# Patient Record
Sex: Female | Born: 1975 | Race: Black or African American | Hispanic: No | Marital: Single | Smoking: Never smoker
Health system: Southern US, Community
[De-identification: ages and names within clinical notes are randomized; demographics above are authoritative.]

## PROBLEM LIST (undated history)

## (undated) DIAGNOSIS — Z90410 Acquired total absence of pancreas: Secondary | ICD-10-CM

## (undated) DIAGNOSIS — E139 Other specified diabetes mellitus without complications: Secondary | ICD-10-CM

## (undated) DIAGNOSIS — I10 Essential (primary) hypertension: Secondary | ICD-10-CM

## (undated) DIAGNOSIS — E119 Type 2 diabetes mellitus without complications: Secondary | ICD-10-CM

## (undated) DIAGNOSIS — E891 Postprocedural hypoinsulinemia: Secondary | ICD-10-CM

## (undated) DIAGNOSIS — Z9081 Acquired absence of spleen: Secondary | ICD-10-CM

## (undated) HISTORY — PX: SPLENECTOMY, PARTIAL: SHX787

## (undated) HISTORY — PX: HERNIA REPAIR: SHX51

## (undated) HISTORY — PX: KNEE SURGERY: SHX244

---

## 1998-12-02 ENCOUNTER — Emergency Department (HOSPITAL_COMMUNITY): Admission: EM | Admit: 1998-12-02 | Discharge: 1998-12-02 | Payer: Self-pay | Admitting: Emergency Medicine

## 2001-07-03 ENCOUNTER — Emergency Department (HOSPITAL_COMMUNITY): Admission: EM | Admit: 2001-07-03 | Discharge: 2001-07-04 | Payer: Self-pay

## 2003-05-17 ENCOUNTER — Other Ambulatory Visit: Admission: RE | Admit: 2003-05-17 | Discharge: 2003-05-17 | Payer: Self-pay | Admitting: Obstetrics and Gynecology

## 2003-07-19 ENCOUNTER — Encounter: Admission: RE | Admit: 2003-07-19 | Discharge: 2003-10-17 | Payer: Self-pay | Admitting: Family Medicine

## 2004-07-25 ENCOUNTER — Encounter: Admission: RE | Admit: 2004-07-25 | Discharge: 2004-10-23 | Payer: Self-pay | Admitting: Family Medicine

## 2005-03-23 ENCOUNTER — Ambulatory Visit (HOSPITAL_COMMUNITY): Admission: RE | Admit: 2005-03-23 | Discharge: 2005-03-23 | Payer: Self-pay | Admitting: Obstetrics & Gynecology

## 2006-12-24 ENCOUNTER — Ambulatory Visit (HOSPITAL_COMMUNITY): Admission: RE | Admit: 2006-12-24 | Discharge: 2006-12-24 | Payer: Self-pay | Admitting: Obstetrics & Gynecology

## 2010-03-16 ENCOUNTER — Encounter: Payer: Self-pay | Admitting: Obstetrics & Gynecology

## 2012-04-15 ENCOUNTER — Emergency Department (HOSPITAL_COMMUNITY): Payer: Self-pay

## 2012-04-15 ENCOUNTER — Emergency Department (HOSPITAL_COMMUNITY)
Admission: EM | Admit: 2012-04-15 | Discharge: 2012-04-15 | Disposition: A | Discharge status: Home / Self Care | Payer: Self-pay | Attending: Emergency Medicine | Admitting: Emergency Medicine

## 2012-04-15 ENCOUNTER — Encounter (HOSPITAL_COMMUNITY): Payer: Self-pay | Admitting: *Deleted

## 2012-04-15 DIAGNOSIS — Y929 Unspecified place or not applicable: Secondary | ICD-10-CM | POA: Insufficient documentation

## 2012-04-15 DIAGNOSIS — S93409A Sprain of unspecified ligament of unspecified ankle, initial encounter: Secondary | ICD-10-CM | POA: Insufficient documentation

## 2012-04-15 DIAGNOSIS — Z79899 Other long term (current) drug therapy: Secondary | ICD-10-CM | POA: Insufficient documentation

## 2012-04-15 DIAGNOSIS — Y9354 Activity, bowling: Secondary | ICD-10-CM | POA: Insufficient documentation

## 2012-04-15 DIAGNOSIS — E119 Type 2 diabetes mellitus without complications: Secondary | ICD-10-CM | POA: Insufficient documentation

## 2012-04-15 DIAGNOSIS — X503XXA Overexertion from repetitive movements, initial encounter: Secondary | ICD-10-CM | POA: Insufficient documentation

## 2012-04-15 DIAGNOSIS — I10 Essential (primary) hypertension: Secondary | ICD-10-CM | POA: Insufficient documentation

## 2012-04-15 HISTORY — DX: Type 2 diabetes mellitus without complications: E11.9

## 2012-04-15 HISTORY — DX: Essential (primary) hypertension: I10

## 2012-04-15 MED ORDER — TRAMADOL HCL 50 MG PO TABS: 50.0000 mg | ORAL_TABLET | Freq: Four times a day (QID) | ORAL | Status: DC | PRN

## 2012-04-15 NOTE — ED Notes (Signed)
Pt reports right ankle pain x1 month. Reports pain goes away and then keeps coming back, at present pain 6/10. Pt thinks she hurt her ankle bowling.

## 2012-04-15 NOTE — ED Provider Notes (Signed)
History     CSN: [id number]  Arrival date & time 04/15/12  1250   First MD Initiated Contact with Patient 04/15/12 1309      Chief Complaint  Patient presents with  . Ankle Pain    (Consider location/radiation/quality/duration/timing/severity/associated sxs/prior treatment) HPI  Bailey Bonilla is a 37 y.o. female complaining of right ankle pain intermittently over the course of the last month. Patient denies any specific trauma however she notes that after she falls the pain always is worse the next day. Pain is located on the medial inferior side of the ankle. She rates as 6/10. It is exacerbated by weightbearing.  Past Medical History  Diagnosis Date  . Hypertension   . Diabetes mellitus without complication     Past Surgical History  Procedure Laterality Date  . Knee surgery      right knee, 1995, acl  . Splenectomy, partial      2012  . Hernia repair      2013    No family history on file.  History  Substance Use Topics  . Smoking status: Never Smoker   . Smokeless tobacco: Not on file  . Alcohol Use: No    OB History   Grav Para Term Preterm Abortions TAB SAB Ect Mult Living                  Review of Systems  Constitutional: Negative for fever.  Respiratory: Negative for shortness of breath.   Cardiovascular: Negative for chest pain.  Gastrointestinal: Negative for nausea, vomiting, abdominal pain and diarrhea.  Musculoskeletal: Positive for arthralgias.  All other systems reviewed and are negative.    Allergies  Review of patient's allergies indicates no known allergies.  Home Medications   Current Outpatient Rx  Name  Route  Sig  Dispense  Refill  . Ascorbic Acid (VITAMIN C PO)   Oral   Take 1 tablet by mouth daily.         . hydrochlorothiazide (HYDRODIURIL) 25 MG tablet   Oral   Take 25 mg by mouth daily.         . metFORMIN (GLUCOPHAGE) 500 MG tablet   Oral   Take 1,000 mg by mouth 2 (two) times daily.         .  methyldopa (ALDOMET) 250 MG tablet   Oral   Take 250 mg by mouth 2 (two) times daily.         . potassium chloride SA (K-DUR,KLOR-CON) 20 MEQ tablet   Oral   Take 20 mEq by mouth 2 (two) times daily.           BP 132/95  Pulse 69  Temp(Src) 98.3 F (36.8 C) (Oral)  Resp 16  SpO2 100%  LMP 04/08/2012  Physical Exam  Nursing note and vitals reviewed. Constitutional: She is oriented to person, place, and time. She appears well-developed and well-nourished. No distress.  Obese  HENT:  Head: Normocephalic.  Eyes: Conjunctivae and EOM are normal.  Cardiovascular: Normal rate.   Pulmonary/Chest: Effort normal. No stridor.  Musculoskeletal: Normal range of motion.       Feet:  Trace swelling and tenderness to palpation of the right medial inferior malleolus. Dorsalis pedis is 2+ bilaterally cap refill is less than 2 seconds and distal sensation is grossly intact.  Neurological: She is alert and oriented to person, place, and time.  Psychiatric: She has a normal mood and affect.    ED Course  Procedures (including critical care  time)  Labs Reviewed - No data to display Dg Ankle Complete Right  04/15/2012  *RADIOLOGY REPORT*  Clinical Data: Right ankle pain  RIGHT ANKLE - COMPLETE 3+ VIEW  Comparison: None.  Findings: Three views of the right ankle submitted.  No acute fracture or subluxation.  Ankle mortise is preserved.  IMPRESSION: No acute fracture or subluxation.   Original Report Authenticated By: Natasha Mead, M.D.      1. Ankle sprain       MDM  Patient with intermittent ankle pain worsening after bowling. Triage initiated x-rays are negative. I will treat this as a sprain.   Pt verbalized understanding and agrees with care plan. Outpatient follow-up and return precautions given.    New Prescriptions   TRAMADOL (ULTRAM) 50 MG TABLET    Take 1 tablet (50 mg total) by mouth every 6 (six) hours as needed for pain.          Wynetta Emery, PA-C 04/15/12  1459

## 2012-04-15 NOTE — ED Provider Notes (Signed)
  Medical screening examination/treatment/procedure(s) were performed by non-physician practitioner and as supervising physician I was immediately available for consultation/collaboration.    Gerhard Munch, MD 04/15/12 [phone]

## 2012-09-16 ENCOUNTER — Encounter (HOSPITAL_BASED_OUTPATIENT_CLINIC_OR_DEPARTMENT_OTHER): Payer: Self-pay | Admitting: *Deleted

## 2012-09-16 ENCOUNTER — Emergency Department (HOSPITAL_BASED_OUTPATIENT_CLINIC_OR_DEPARTMENT_OTHER): Payer: Self-pay

## 2012-09-16 ENCOUNTER — Emergency Department (HOSPITAL_BASED_OUTPATIENT_CLINIC_OR_DEPARTMENT_OTHER)
Admission: EM | Admit: 2012-09-16 | Discharge: 2012-09-16 | Disposition: A | Discharge status: Home / Self Care | Payer: Self-pay | Attending: Emergency Medicine | Admitting: Emergency Medicine

## 2012-09-16 DIAGNOSIS — Z79899 Other long term (current) drug therapy: Secondary | ICD-10-CM | POA: Insufficient documentation

## 2012-09-16 DIAGNOSIS — G8929 Other chronic pain: Secondary | ICD-10-CM | POA: Insufficient documentation

## 2012-09-16 DIAGNOSIS — E119 Type 2 diabetes mellitus without complications: Secondary | ICD-10-CM | POA: Insufficient documentation

## 2012-09-16 DIAGNOSIS — I10 Essential (primary) hypertension: Secondary | ICD-10-CM | POA: Insufficient documentation

## 2012-09-16 DIAGNOSIS — Z9889 Other specified postprocedural states: Secondary | ICD-10-CM | POA: Insufficient documentation

## 2012-09-16 DIAGNOSIS — M25579 Pain in unspecified ankle and joints of unspecified foot: Secondary | ICD-10-CM | POA: Insufficient documentation

## 2012-09-16 NOTE — ED Notes (Signed)
Injured right ankle in January has been having intermittent pain and swelling since then with no new injury

## 2012-09-16 NOTE — ED Provider Notes (Signed)
CSN: [id number]     Arrival date & time 09/16/12  0813 History     First MD Initiated Contact with Patient 09/16/12 [phone]     Chief Complaint  Patient presents with  . Ankle Pain   (Consider location/radiation/quality/duration/timing/severity/associated sxs/prior Treatment) HPI\\  Patient with right ankle pain since sprain in January.  Patient states intermittently swells up and painful medial aspect.  States she did not follow up as instructed due to lack of insurance.  No definitive new trauma has occurred.  She has pain and swelling medial aspect of right ankle which decreases with rest and worsens with running and use.  She has been using an ace bandage to keep swelling down.   Past Medical History  Diagnosis Date  . Hypertension   . Diabetes mellitus without complication    Past Surgical History  Procedure Laterality Date  . Knee surgery      right knee, 1995, acl  . Splenectomy, partial      2012  . Hernia repair      2013   History reviewed. No pertinent family history. History  Substance Use Topics  . Smoking status: Never Smoker   . Smokeless tobacco: Not on file  . Alcohol Use: No   OB History   Grav Para Term Preterm Abortions TAB SAB Ect Mult Living                 Review of Systems  All other systems reviewed and are negative.    Allergies  Review of patient's allergies indicates no known allergies.  Home Medications   Current Outpatient Rx  Name  Route  Sig  Dispense  Refill  . Ascorbic Acid (VITAMIN C PO)   Oral   Take 1 tablet by mouth daily.         . hydrochlorothiazide (HYDRODIURIL) 25 MG tablet   Oral   Take 25 mg by mouth daily.         . metFORMIN (GLUCOPHAGE) 500 MG tablet   Oral   Take 1,000 mg by mouth 2 (two) times daily.         . methyldopa (ALDOMET) 250 MG tablet   Oral   Take 250 mg by mouth 2 (two) times daily.         . potassium chloride SA (K-DUR,KLOR-CON) 20 MEQ tablet   Oral   Take 20 mEq by mouth 2  (two) times daily.         . traMADol (ULTRAM) 50 MG tablet   Oral   Take 1 tablet (50 mg total) by mouth every 6 (six) hours as needed for pain.   15 tablet   0    BP 129/87  Pulse 73  Temp(Src) 98.5 F (36.9 C) (Oral)  Resp 16  Ht 5\' 10"  (1.778 m)  Wt 293 lb (132.904 kg)  BMI 42.04 kg/m2  SpO2 99%  LMP 08/30/2012 Physical Exam  Nursing note and vitals reviewed. Constitutional: She appears well-developed and well-nourished.  HENT:  Head: Normocephalic and atraumatic.  Eyes: Pupils are equal, round, and reactive to light.  Cardiovascular: Normal rate and regular rhythm.   Pulmonary/Chest: Effort normal.  Abdominal: Soft. Bowel sounds are normal.  Musculoskeletal: Normal range of motion. She exhibits tenderness. She exhibits no edema.       Feet:  Neurological: She is alert.  Skin: Skin is warm and dry.  Psychiatric: She has a normal mood and affect.    ED Course   Procedures (including  critical care time)  Labs Reviewed - No data to display Dg Ankle Complete Right  09/16/2012   *RADIOLOGY REPORT*  Clinical Data: Medial right ankle pain, swelling.  RIGHT ANKLE - COMPLETE 3+ VIEW  Comparison: 04/15/2012  Findings: No acute bony abnormality.  Specifically, no fracture, subluxation, or dislocation.  Soft tissues are intact. Joint spaces are maintained.  Normal bone mineralization.  IMPRESSION: Negative.   Original Report Authenticated By: Charlett Nose, M.D.   1. Ankle pain, chronic, right     MDM  Patient referred to follow up with Dr. Pearletha Forge.  Patient has had pain since prior sprain.  No fracture seen on x-Madinah Quarry.   Hilario Quarry, MD 09/16/12 [phone]

## 2012-10-14 ENCOUNTER — Ambulatory Visit: Payer: No Typology Code available for payment source | Attending: Family Medicine | Admitting: Internal Medicine

## 2012-10-14 VITALS — BP 130/80 | HR 84 | Temp 97.8°F | Resp 16 | Wt 287.6 lb

## 2012-10-14 DIAGNOSIS — E1110 Type 2 diabetes mellitus with ketoacidosis without coma: Secondary | ICD-10-CM

## 2012-10-14 DIAGNOSIS — E1310 Other specified diabetes mellitus with ketoacidosis without coma: Secondary | ICD-10-CM

## 2012-10-14 DIAGNOSIS — I10 Essential (primary) hypertension: Secondary | ICD-10-CM | POA: Insufficient documentation

## 2012-10-14 DIAGNOSIS — E119 Type 2 diabetes mellitus without complications: Secondary | ICD-10-CM | POA: Insufficient documentation

## 2012-10-14 LAB — CBC WITH DIFFERENTIAL/PLATELET
Basophils Absolute: 0.1 10*3/uL (ref 0.0–0.1)
Eosinophils Relative: 0 % (ref 0–5)
Lymphocytes Relative: 30 % (ref 12–46)
Lymphs Abs: 2.4 10*3/uL (ref 0.7–4.0)
MCV: 82.4 fL (ref 78.0–100.0)
Monocytes Absolute: 0.5 10*3/uL (ref 0.1–1.0)
Monocytes Relative: 6 % (ref 3–12)
RBC: 4.14 MIL/uL (ref 3.87–5.11)
RDW: 17.9 % — ABNORMAL HIGH (ref 11.5–15.5)
WBC: 8 10*3/uL (ref 4.0–10.5)

## 2012-10-14 LAB — LIPID PANEL
Cholesterol: 145 mg/dL (ref 0–200)
Total CHOL/HDL Ratio: 3.5 Ratio
Triglycerides: 72 mg/dL (ref <150)
VLDL: 14 mg/dL (ref 0–40)

## 2012-10-14 LAB — TSH: TSH: 0.939 u[IU]/mL (ref 0.350–4.500)

## 2012-10-14 LAB — COMPREHENSIVE METABOLIC PANEL
ALT: 8 U/L (ref 0–35)
Alkaline Phosphatase: 74 U/L (ref 39–117)
CO2: 30 mEq/L (ref 19–32)
Chloride: 100 mEq/L (ref 96–112)
Creat: 0.88 mg/dL (ref 0.50–1.10)
Total Bilirubin: 0.7 mg/dL (ref 0.3–1.2)
Total Protein: 7.1 g/dL (ref 6.0–8.3)

## 2012-10-14 LAB — POCT GLYCOSYLATED HEMOGLOBIN (HGB A1C): Hemoglobin A1C: 7.4

## 2012-10-14 LAB — GLUCOSE, POCT (MANUAL RESULT ENTRY): POC Glucose: 209 mg/dl — AB (ref 70–99)

## 2012-10-14 MED ORDER — METHYLDOPA 250 MG PO TABS: 250.0000 mg | ORAL_TABLET | Freq: Two times a day (BID) | ORAL | Status: DC

## 2012-10-14 MED ORDER — HYDROCHLOROTHIAZIDE 25 MG PO TABS: 25.0000 mg | ORAL_TABLET | Freq: Every day | ORAL | Status: DC

## 2012-10-14 MED ORDER — POTASSIUM CHLORIDE CRYS ER 20 MEQ PO TBCR: 20.0000 meq | EXTENDED_RELEASE_TABLET | Freq: Two times a day (BID) | ORAL | Status: DC

## 2012-10-14 MED ORDER — MENINGOCOCCAL VAC A,C,Y,W-135 ~~LOC~~ INJ: 0.5000 mL | INJECTION | Freq: Once | SUBCUTANEOUS | Status: DC

## 2012-10-14 NOTE — Progress Notes (Signed)
Patient ID: Bailey Bonilla, female   DOB: Oct 22, 1975, 37 y.o.   MRN: [id number]  CC:  HPI: 37 year old female is here to establish care. The patient is tearful at the time of this interview. She states that she has had 2 abdominal surgeries. 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 September 2013 the patient had a hernia repair. Since her pancreatictomy the patient has been a diabetic She has been on metformin and has been taking it sporadically because she cannot afford it. She does not check her sugar on a regular basis She does not have a glucometer She is unaware of her latest hemoglobin A1c She denies any chest pain any shortness of breath.  She is unsure about her vaccination status    No Known Allergies Past Medical History  Diagnosis Date  . Hypertension   . Diabetes mellitus without complication    Current Outpatient Prescriptions on File Prior to Visit  Medication Sig Dispense Refill  . Ascorbic Acid (VITAMIN C PO) Take 1 tablet by mouth daily.      . metFORMIN (GLUCOPHAGE) 500 MG tablet Take 1,000 mg by mouth 2 (two) times daily.      . traMADol (ULTRAM) 50 MG tablet Take 1 tablet (50 mg total) by mouth every 6 (six) hours as needed for pain.  15 tablet  0   No current facility-administered medications on file prior to visit.   History reviewed. No pertinent family history. History   Social History  . Marital Status: Single    Spouse Name: N/A    Number of Children: N/A  . Years of Education: N/A   Occupational History  . Not on file.   Social History Main Topics  . Smoking status: Never Smoker   . Smokeless tobacco: Not on file  . Alcohol Use: No  . Drug Use: No  . Sexual Activity: Not on file   Other Topics Concern  . Not on file   Social History Narrative  . No narrative on file    Review of Systems  Constitutional: Negative for fever, chills, diaphoresis, activity change, appetite  change and fatigue.  HENT: Negative for ear pain, nosebleeds, congestion, facial swelling, rhinorrhea, neck pain, neck stiffness and ear discharge.   Eyes: Negative for pain, discharge, redness, itching and visual disturbance.  Respiratory: Negative for cough, choking, chest tightness, shortness of breath, wheezing and stridor.   Cardiovascular: Negative for chest pain, palpitations and leg swelling.  Gastrointestinal: Negative for abdominal distention.  Genitourinary: Negative for dysuria, urgency, frequency, hematuria, flank pain, decreased urine volume, difficulty urinating and dyspareunia.  Musculoskeletal: Negative for back pain, joint swelling, arthralgias and gait problem.  Neurological: Negative for dizziness, tremors, seizures, syncope, facial asymmetry, speech difficulty, weakness, light-headedness, numbness and headaches.  Hematological: Negative for adenopathy. Does not bruise/bleed easily.  Psychiatric/Behavioral: Negative for hallucinations, behavioral problems, confusion, dysphoric mood, decreased concentration and agitation.    Objective:   Filed Vitals:   10/14/12 1032  BP: 130/80  Pulse: 84  Temp: 97.8 F (36.6 C)  Resp: 16    Physical Exam  Constitutional: Appears well-developed and well-nourished. No distress.  HENT: Normocephalic. External right and left ear normal. Oropharynx is clear and moist.  Eyes: Conjunctivae and EOM are normal. PERRLA, no scleral icterus.  Neck: Normal ROM. Neck supple. No JVD. No tracheal deviation. No thyromegaly.  CVS: RRR, S1/S2 +, no murmurs, no gallops, no carotid bruit.  Pulmonary: Effort  and breath sounds normal, no stridor, rhonchi, wheezes, rales.  Abdominal: Soft. BS +,  no distension, tenderness, rebound or guarding.  Musculoskeletal: Normal range of motion. No edema and no tenderness.  Lymphadenopathy: No lymphadenopathy noted, cervical, inguinal. Neuro: Alert. Normal reflexes, muscle tone coordination. No cranial nerve  deficit. Skin: Skin is warm and dry. No rash noted. Not diaphoretic. No erythema. No pallor.  Psychiatric: Normal mood and affect. Behavior, judgment, thought content normal.   No results found for this basename: WBC, HGB, HCT, MCV, PLT   No results found for this basename: CREATININE, BUN, NA, K, CL, CO2    Lab Results  Component Value Date   HGBA1C 7.4% 10/14/2012   Lipid Panel  No results found for this basename: chol, trig, hdl, cholhdl, vldl, ldlcalc       Assessment and plan:   There are no active problems to display for this patient.      Establish care Pap smear breast exam referral, to gynecology Baseline labs including CBC, CMP, lipid panel, TSH, vitamin D, hemoglobin A1c  Hypertension Meds levodopa/HCTZ refill  Diabetes mellitus Hemoglobin A1c in metformin refilled  Status post pancreatectomy and splenectomy Vaccine Dose Route Revaccination Polyvalent pneumococcal 0.5 mL [place]* Every 6 years Quadravalent meningococcal/diphtheria conjugate 0.5 mL IM upper deltoid Every 3-5 years? Quadravalent meningococcal polysaccharide 0.5 mL [place]* Every 3-5 years Haemophilus b conjugate 0.5 mL IM* None  Based on the above vaccination schedule patient is probably due for her meningococcal vaccination, this has to be ordered The patient will come back in one week for this The patient also needs a repeat CT scan of her abdomen and pelvis as there was some concern that her cyst was malignant We'll need to obtain records from [facility] where she had her surgery

## 2012-10-14 NOTE — Progress Notes (Signed)
Patient here to establish care History of DM and HTN

## 2012-10-17 ENCOUNTER — Encounter (HOSPITAL_COMMUNITY): Payer: Self-pay

## 2012-10-17 ENCOUNTER — Ambulatory Visit (HOSPITAL_COMMUNITY)
Admission: RE | Admit: 2012-10-17 | Discharge: 2012-10-17 | Disposition: A | Discharge status: Home / Self Care | Payer: Self-pay | Source: Ambulatory Visit | Attending: Internal Medicine | Admitting: Internal Medicine

## 2012-10-17 ENCOUNTER — Telehealth: Payer: Self-pay | Admitting: Internal Medicine

## 2012-10-17 DIAGNOSIS — N83209 Unspecified ovarian cyst, unspecified side: Secondary | ICD-10-CM | POA: Insufficient documentation

## 2012-10-17 DIAGNOSIS — Z9089 Acquired absence of other organs: Secondary | ICD-10-CM | POA: Insufficient documentation

## 2012-10-17 DIAGNOSIS — E1110 Type 2 diabetes mellitus with ketoacidosis without coma: Secondary | ICD-10-CM

## 2012-10-17 DIAGNOSIS — Z90411 Acquired partial absence of pancreas: Secondary | ICD-10-CM | POA: Insufficient documentation

## 2012-10-17 DIAGNOSIS — K862 Cyst of pancreas: Secondary | ICD-10-CM | POA: Insufficient documentation

## 2012-10-17 DIAGNOSIS — Z09 Encounter for follow-up examination after completed treatment for conditions other than malignant neoplasm: Secondary | ICD-10-CM | POA: Insufficient documentation

## 2012-10-17 HISTORY — DX: Postprocedural hypoinsulinemia: E89.1

## 2012-10-17 HISTORY — DX: Acquired absence of spleen: Z90.81

## 2012-10-17 HISTORY — DX: Other specified diabetes mellitus without complications: E13.9

## 2012-10-17 HISTORY — DX: Acquired total absence of pancreas: Z90.410

## 2012-10-17 MED ORDER — IOHEXOL 300 MG/ML  SOLN
100.0000 mL | Freq: Once | INTRAMUSCULAR | Status: AC | PRN
  Administered 2012-10-17: 100 mL via INTRAVENOUS

## 2012-10-17 NOTE — Telephone Encounter (Signed)
Pt advised that CT scan appt specific to body part Dr has ordered scan for.  Any further imaging has to be ordered Dr.

## 2012-10-17 NOTE — Telephone Encounter (Signed)
Pt is scheduled to have a CT scan of abd.  Says that her ankle is still swelling and was told that in the future she might need more imaging on ankle (she has already had 2 x-rays).  Pt is asking whether she can have CT scan on ankle done while she gets the CT of abd.  Please f/u with pt.

## 2012-10-21 ENCOUNTER — Ambulatory Visit: Payer: Self-pay | Attending: Internal Medicine

## 2012-10-26 ENCOUNTER — Ambulatory Visit: Payer: No Typology Code available for payment source | Attending: Internal Medicine | Admitting: Internal Medicine

## 2012-10-26 ENCOUNTER — Encounter: Payer: Self-pay | Admitting: Internal Medicine

## 2012-10-26 VITALS — BP 143/84 | HR 80 | Temp 98.0°F | Resp 17 | Wt 292.8 lb

## 2012-10-26 DIAGNOSIS — Z9889 Other specified postprocedural states: Secondary | ICD-10-CM

## 2012-10-26 DIAGNOSIS — Z90410 Acquired total absence of pancreas: Secondary | ICD-10-CM | POA: Insufficient documentation

## 2012-10-26 DIAGNOSIS — E119 Type 2 diabetes mellitus without complications: Secondary | ICD-10-CM | POA: Insufficient documentation

## 2012-10-26 DIAGNOSIS — N83209 Unspecified ovarian cyst, unspecified side: Secondary | ICD-10-CM | POA: Insufficient documentation

## 2012-10-26 NOTE — Progress Notes (Signed)
Patient ID: Bailey Bonilla, female   DOB: [birth year], 37 y.o.   MRN: [id number] Patient Demographics  Bailey Bonilla, is a 37 y.o. female  JYN:[id number]  [id number]  DOB - Mar 01, 1975  Chief Complaint  Patient presents with  . Follow-up        Subjective:   Bailey Bonilla with History of partial pancreatic and splenic resection at [facility] and 2013 who recently had a CT scan done here in the clinic  is here for follow up on CT scan results  Denies any subjective complaints except as above, no active headache, no chest abdominal pain at this time, not short of breath. No focal weakness which is new.    Objective:    Patient Active Problem List   Diagnosis Date Noted  . H/O resection of pancreas 10/26/2012  . DM2 (diabetes mellitus, type 2) 10/26/2012     Filed Vitals:   10/26/12 1039  BP: 143/84  Pulse: 80  Temp: 98 F (36.7 C)  Resp: 17  Weight: 292 lb 12.8 oz (132.813 kg)  SpO2: 100%     Exam   Awake Alert, Oriented X 3, No new F.N deficits, Normal affect Joiner.AT,PERRAL Supple Neck,No JVD, No cervical lymphadenopathy appriciated.  Symmetrical Chest wall movement, Good air movement bilaterally, CTAB RRR,No Gallops,Rubs or new Murmurs, No Parasternal Heave +ve B.Sounds, Abd Soft, Non tender, No organomegaly appriciated, No rebound - guarding or rigidity. No Cyanosis, Clubbing or edema, No new Rash or bruise       Data Review   CBC No results found for this basename: WBC, HGB, HCT, PLT, MCV, MCH, MCHC, RDW, NEUTRABS, LYMPHSABS, MONOABS, EOSABS, BASOSABS, BANDABS, BANDSABD,  in the last 168 hours  Chemistries   No results found for this basename: NA, K, CL, CO2, GLUCOSE, BUN, CREATININE, GFRCGP, CALCIUM, MG, AST, ALT, ALKPHOS, BILITOT,  in the last 168 hours ------------------------------------------------------------------------------------------------------------------ No results found for this basename: HGBA1C,  in the last 72  hours ------------------------------------------------------------------------------------------------------------------ No results found for this basename: CHOL, HDL, LDLCALC, TRIG, CHOLHDL, LDLDIRECT,  in the last 72 hours ------------------------------------------------------------------------------------------------------------------ No results found for this basename: TSH, T4TOTAL, FREET3, T3FREE, THYROIDAB,  in the last 72 hours ------------------------------------------------------------------------------------------------------------------ No results found for this basename: VITAMINB12, FOLATE, FERRITIN, TIBC, IRON, RETICCTPCT,  in the last 72 hours  Coagulation profile  No results found for this basename: INR, PROTIME,  in the last 168 hours     Prior to Admission medications   Medication Sig Start Date End Date Taking? Authorizing Provider  Ascorbic Acid (VITAMIN C PO) Take 1 tablet by mouth daily.   Yes Historical Provider, MD  folic acid (FOLVITE) 800 MCG tablet Take 400 mcg by mouth daily.   Yes Historical Provider, MD  hydrochlorothiazide (HYDRODIURIL) 25 MG tablet Take 1 tablet (25 mg total) by mouth daily. 10/14/12  Yes Richarda Overlie, MD  metFORMIN (GLUCOPHAGE) 500 MG tablet Take 1,000 mg by mouth 2 (two) times daily.   Yes Historical Provider, MD  methyldopa (ALDOMET) 250 MG tablet Take 1 tablet (250 mg total) by mouth 2 (two) times daily. 10/14/12  Yes Richarda Overlie, MD  potassium chloride (MICRO-K) 10 MEQ CR capsule Take 10 mEq by mouth daily.   Yes Historical Provider, MD  vitamin C (ASCORBIC ACID) 500 MG tablet Take 500 mg by mouth daily.   Yes Historical Provider, MD  meningococcal vaccine [facility]) injection Inject 0.5 mLs into the skin once. 10/14/12   Richarda Overlie, MD  potassium chloride SA (K-DUR,KLOR-CON) 20 MEQ tablet Take  1 tablet (20 mEq total) by mouth 2 (two) times daily. 10/14/12   Richarda Overlie, MD  traMADol (ULTRAM) 50 MG tablet Take 1 tablet (50 mg total) by  mouth every 6 (six) hours as needed for pain. 04/15/12   Wynetta Emery, PA-C     Assessment & Plan    History of pancreatic and splenic resection both partial. Done in 2013 at [facility], patient's CT scan is noted with some nonspecific changes, patient has been referred to local general surgery and requested to forward CT scan reports to her surgeon at [facility] and discussed over the phone with further plan, if needed followup with them one time.   For a partial splenic resection discussed immunization schedule with Dr. Orvan Falconer infectious disease who recommended  influenza shot every year, She will require Pneumovax shot once every 5 years next do in 2018, HIM shot once which she sees prior to her splenic resection.   Diabetes mellitus type 2 A1c was 7.3, she's been on metformin, requested to do Accu-Cheks q. a.c. at bedtime and bring a log book next visit in one month.   Chronic right ankle discomfort, x-rays are unremarkable, this is likely soft tissue strain, twice to where good quality reticulocyte shoes and avoid wearing heels, if discomfort persists we will refer to orthopedics.   Ovarian cyst and endometrial thickening noted on CT scan. I have referred her to [facility] for further workup. She also is due for a Pap smear.    Routine health maintenance.  Screening labs. CBC, CMP, TSH, A1c, lipid panel noted   Immunizations influenza shot next visit  She will require Pneumovax shot once every 5 years next do in 2018  HIM shot once which she sees prior to her splenic resection       Leroy Sea M.D on 10/26/2012 at 10:59 AM

## 2012-10-26 NOTE — Progress Notes (Signed)
Patient here for follow up  Had blood work done and CT of abd Had hernia repair and partial spleenectomy

## 2012-10-26 NOTE — Patient Instructions (Addendum)
Please followup with her surgeon at [facility] with new CT scan report, you will also be referred to a general surgeon locally. Follow with the requested local general surgeon in [facility] physician.  You will require influenza vaccine every year  Pneumovax vaccine once every 5 years  Hemophilus influenza vaccine once which have received before your surgery  Accuchecks 4 times/day, Once in AM empty stomach and then before each meal. Log in all results and bring them next visit in

## 2012-10-31 ENCOUNTER — Encounter: Payer: Self-pay | Admitting: Obstetrics & Gynecology

## 2012-11-09 ENCOUNTER — Ambulatory Visit: Payer: No Typology Code available for payment source | Attending: Family Medicine | Admitting: Internal Medicine

## 2012-11-09 ENCOUNTER — Encounter: Payer: Self-pay | Admitting: Internal Medicine

## 2012-11-09 VITALS — BP 150/98 | HR 67 | Temp 98.8°F | Resp 16 | Ht 71.0 in | Wt 291.0 lb

## 2012-11-09 DIAGNOSIS — Z90410 Acquired total absence of pancreas: Secondary | ICD-10-CM

## 2012-11-09 DIAGNOSIS — Z23 Encounter for immunization: Secondary | ICD-10-CM

## 2012-11-09 DIAGNOSIS — Z9889 Other specified postprocedural states: Secondary | ICD-10-CM

## 2012-11-09 DIAGNOSIS — E119 Type 2 diabetes mellitus without complications: Secondary | ICD-10-CM | POA: Insufficient documentation

## 2012-11-09 NOTE — Progress Notes (Signed)
Pt is here to f/u on her high blood sugar. Pt reports that her right ankle has been giving her pain since Jan. Hurts to touch, aches, tender, and hot. Pt is also requesting a flu shot.

## 2012-11-09 NOTE — Progress Notes (Signed)
Patient Demographics  Bailey Bonilla, is a 37 y.o. female  ZOX:[id number]  [id number]:[id number]  DOB - March 18, 1975  Chief Complaint  Patient presents with  . Follow-up        Subjective:   Bailey Bonilla today is here for a follow up visit. She has no complaints. She is here for a influenza vaccination.  Patient has No headache, No chest pain, No abdominal pain - No Nausea, No new weakness tingling or numbness, No Cough - SOB.  Objective:    Filed Vitals:   11/09/12 1141  BP: 150/98  Pulse: 67  Temp: 98.8 F (37.1 C)  TempSrc: Oral  Resp: 16  Height: 5\' 11"  (1.803 m)  Weight: 291 lb (131.997 kg)  SpO2: 100%     ALLERGIES:  No Known Allergies  PAST MEDICAL HISTORY: Past Medical History  Diagnosis Date  . Hypertension   . Diabetes mellitus without complication   . H/O splenectomy   . 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 Authorizing Provider  Ascorbic Acid (VITAMIN C PO) Take 1 tablet by mouth daily.   Yes Historical Provider, MD  folic acid (FOLVITE) 800 MCG tablet Take 400 mcg by mouth daily.   Yes Historical Provider, MD  hydrochlorothiazide (HYDRODIURIL) 25 MG tablet Take 1 tablet (25 mg total) by mouth daily. 10/14/12  Yes Richarda Overlie, MD  metFORMIN (GLUCOPHAGE) 500 MG tablet Take 1,000 mg by mouth 2 (two) times daily.   Yes Historical Provider, MD  methyldopa (ALDOMET) 250 MG tablet Take 1 tablet (250 mg total) by mouth 2 (two) times daily. 10/14/12  Yes Richarda Overlie, MD  potassium chloride (MICRO-K) 10 MEQ CR capsule Take 10 mEq by mouth daily.   Yes Historical Provider, MD  potassium chloride SA (K-DUR,KLOR-CON) 20 MEQ tablet Take 1 tablet (20 mEq total) by mouth 2 (two) times daily. 10/14/12  Yes Richarda Overlie, MD  meningococcal vaccine [facility]) injection Inject 0.5 mLs into the skin once. 10/14/12   Richarda Overlie, MD  traMADol (ULTRAM) 50 MG tablet Take 1  tablet (50 mg total) by mouth every 6 (six) hours as needed for pain. 04/15/12   Nicole Pisciotta, PA-C  vitamin C (ASCORBIC ACID) 500 MG tablet Take 500 mg by mouth daily.    Historical Provider, MD     Exam  General appearance :Awake, alert, not in any distress. Speech Clear. Not toxic Looking HEENT: Atraumatic and Normocephalic, pupils equally reactive to light and accomodation Neck: supple, no JVD. No cervical lymphadenopathy.  Chest:Good air entry bilaterally, no added sounds  CVS: S1 S2 regular, no murmurs.  Abdomen: Bowel sounds present, Non tender and not distended with no gaurding, rigidity or rebound. Extremities: B/L Lower Ext shows no edema, both legs are warm to touch Neurology: Awake alert, and oriented X 3, CN II-XII intact, Non focal Skin:No Rash Wounds:N/A    Data Review   CBC No results found for this basename: WBC, HGB, HCT, PLT, MCV, MCH, MCHC, RDW, NEUTRABS, LYMPHSABS, MONOABS, EOSABS, BASOSABS, BANDABS, BANDSABD,  in the last 168 hours  Chemistries   No results found for this basename: NA, K, CL, CO2, GLUCOSE, BUN, CREATININE, GFRCGP, CALCIUM, MG, AST, ALT, ALKPHOS, BILITOT,  in the last 168 hours ------------------------------------------------------------------------------------------------------------------ No results found for this basename: HGBA1C,  in the last 72 hours ------------------------------------------------------------------------------------------------------------------ No results found for this basename: CHOL, HDL, LDLCALC, TRIG, CHOLHDL, LDLDIRECT,  in the last 72  hours ------------------------------------------------------------------------------------------------------------------ No results found for this basename: TSH, T4TOTAL, FREET3, T3FREE, THYROIDAB,  in the last 72 hours ------------------------------------------------------------------------------------------------------------------ No results found for this basename: VITAMINB12,  FOLATE, FERRITIN, TIBC, IRON, RETICCTPCT,  in the last 72 hours  Coagulation profile  No results found for this basename: INR, PROTIME,  in the last 168 hours    Assessment & Plan  DM - Continue metformin - A1c 7.3, probably needs another agent added if continues to be in this range in the next few months. She is young and we can be aggressive with her A1c control.  Status post partial pancreatectomy and splenectomy - Done Romeo Apple for a benign cystic lesion last year -She need a influenza shot every year, She will require Pneumovax shot once every 5 years next do in 2018, HIM shot once which she sees prior to her splenic resection - She has been referred to Gen. surgery by Dr. Thedore Mins last visit.   Health Maintenance -Vaccinations:  -Influenza-today  Referred to GYN clinic for Pap smear  Follow up in 2 months   The patient was given clear instructions to go to ER or return to medical center if symptoms don't improve, worsen or new problems develop. The patient verbalized understanding. The patient was told to call to get lab results if they haven't heard anything in the next week.

## 2012-11-14 ENCOUNTER — Encounter: Payer: Self-pay | Admitting: *Deleted

## 2012-11-29 ENCOUNTER — Other Ambulatory Visit: Payer: Self-pay | Admitting: Emergency Medicine

## 2012-11-29 MED ORDER — METFORMIN HCL 500 MG PO TABS: 1000.0000 mg | ORAL_TABLET | Freq: Two times a day (BID) | ORAL | Status: DC

## 2012-12-15 ENCOUNTER — Ambulatory Visit (INDEPENDENT_AMBULATORY_CARE_PROVIDER_SITE_OTHER): Payer: No Typology Code available for payment source | Admitting: Obstetrics & Gynecology

## 2012-12-15 ENCOUNTER — Encounter: Payer: Self-pay | Admitting: Obstetrics & Gynecology

## 2012-12-15 VITALS — BP 134/93 | HR 86 | Temp 97.8°F | Ht 70.0 in | Wt 292.7 lb

## 2012-12-15 DIAGNOSIS — Z01419 Encounter for gynecological examination (general) (routine) without abnormal findings: Secondary | ICD-10-CM

## 2012-12-15 DIAGNOSIS — Z113 Encounter for screening for infections with a predominantly sexual mode of transmission: Secondary | ICD-10-CM

## 2012-12-15 DIAGNOSIS — IMO0002 Reserved for concepts with insufficient information to code with codable children: Secondary | ICD-10-CM

## 2012-12-15 DIAGNOSIS — N9489 Other specified conditions associated with female genital organs and menstrual cycle: Secondary | ICD-10-CM

## 2012-12-15 NOTE — Progress Notes (Signed)
Had CT scan on 8/25- results in epic; has not been taking blood pressure medication due to cost

## 2012-12-15 NOTE — Patient Instructions (Signed)
Preventive Care for Adults, Female A healthy lifestyle and preventive care can promote health and wellness. Preventive health guidelines for women include the following key practices.  A routine yearly physical is a good way to check with your caregiver about your health and preventive screening. It is a chance to share any concerns and updates on your health, and to receive a thorough exam.  Visit your dentist for a routine exam and preventive care every 6 months. Brush your teeth twice a day and floss once a day. Good oral hygiene prevents tooth decay and gum disease.  The frequency of eye exams is based on your age, health, family medical history, use of contact lenses, and other factors. Follow your caregiver's recommendations for frequency of eye exams.  Eat a healthy diet. Foods like vegetables, fruits, whole grains, low-fat dairy products, and lean protein foods contain the nutrients you need without too many calories. Decrease your intake of foods high in solid fats, added sugars, and salt. Eat the right amount of calories for you.Get information about a proper diet from your caregiver, if necessary.  Regular physical exercise is one of the most important things you can do for your health. Most adults should get at least 150 minutes of moderate-intensity exercise (any activity that increases your heart rate and causes you to sweat) each week. In addition, most adults need muscle-strengthening exercises on 2 or more days a week.  Maintain a healthy weight. The body mass index (BMI) is a screening tool to identify possible weight problems. It provides an estimate of body fat based on height and weight. Your caregiver can help determine your BMI, and can help you achieve or maintain a healthy weight.For adults 20 years and older:  A BMI below 18.5 is considered underweight.  A BMI of 18.5 to 24.9 is normal.  A BMI of 25 to 29.9 is considered overweight.  A BMI of 30 and above is  considered obese.  Maintain normal blood lipids and cholesterol levels by exercising and minimizing your intake of saturated fat. Eat a balanced diet with plenty of fruit and vegetables. Blood tests for lipids and cholesterol should begin at age 41 and be repeated every 5 years. If your lipid or cholesterol levels are high, you are over 50, or you are at high risk for heart disease, you may need your cholesterol levels checked more frequently.Ongoing high lipid and cholesterol levels should be treated with medicines if diet and exercise are not effective.  If you smoke, find out from your caregiver how to quit. If you do not use tobacco, do not start.  If you are pregnant, do not drink alcohol. If you are breastfeeding, be very cautious about drinking alcohol. If you are not pregnant and choose to drink alcohol, do not exceed 1 drink per day. One drink is considered to be 12 ounces (355 mL) of beer, 5 ounces (148 mL) of wine, or 1.5 ounces (44 mL) of liquor.  Avoid use of street drugs. Do not share needles with anyone. Ask for help if you need support or instructions about stopping the use of drugs.  High blood pressure causes heart disease and increases the risk of stroke. Your blood pressure should be checked at least every 1 to 2 years. Ongoing high blood pressure should be treated with medicines if weight loss and exercise are not effective.  If you are 65 to 36 years old, ask your caregiver if you should take aspirin to prevent strokes.  Diabetes  screening involves taking a blood sample to check your fasting blood sugar level. This should be done once every 3 years, after age 45, if you are within normal weight and without risk factors for diabetes. Testing should be considered at a younger age or be carried out more frequently if you are overweight and have at least 1 risk factor for diabetes.  Breast cancer screening is essential preventive care for women. You should practice "breast  self-awareness." This means understanding the normal appearance and feel of your breasts and may include breast self-examination. Any changes detected, no matter how small, should be reported to a caregiver. Women in their 20s and 30s should have a clinical breast exam (CBE) by a caregiver as part of a regular health exam every 1 to 3 years. After age 40, women should have a CBE every year. Starting at age 40, women should consider having a mammography (breast X-ray test) every year. Women who have a family history of breast cancer should talk to their caregiver about genetic screening. Women at a high risk of breast cancer should talk to their caregivers about having magnetic resonance imaging (MRI) and a mammography every year.  The Pap test is a screening test for cervical cancer. A Pap test can show cell changes on the cervix that might become cervical cancer if left untreated. A Pap test is a procedure in which cells are obtained and examined from the lower end of the uterus (cervix).  Women should have a Pap test starting at age 21.  Between ages 21 and 29, Pap tests should be repeated every 2 years.  Beginning at age 30, you should have a Pap test every 3 years as long as the past 3 Pap tests have been normal.  Some women have medical problems that increase the chance of getting cervical cancer. Talk to your caregiver about these problems. It is especially important to talk to your caregiver if a new problem develops soon after your last Pap test. In these cases, your caregiver may recommend more frequent screening and Pap tests.  The above recommendations are the same for women who have or have not gotten the vaccine for human papillomavirus (HPV).  If you had a hysterectomy for a problem that was not cancer or a condition that could lead to cancer, then you no longer need Pap tests. Even if you no longer need a Pap test, a regular exam is a good idea to make sure no other problems are  starting.  If you are between ages 65 and 70, and you have had normal Pap tests going back 10 years, you no longer need Pap tests. Even if you no longer need a Pap test, a regular exam is a good idea to make sure no other problems are starting.  If you have had past treatment for cervical cancer or a condition that could lead to cancer, you need Pap tests and screening for cancer for at least 20 years after your treatment.  If Pap tests have been discontinued, risk factors (such as a new sexual partner) need to be reassessed to determine if screening should be resumed.  The HPV test is an additional test that may be used for cervical cancer screening. The HPV test looks for the virus that can cause the cell changes on the cervix. The cells collected during the Pap test can be tested for HPV. The HPV test could be used to screen women aged 30 years and older, and should   be used in women of any age who have unclear Pap test results. After the age of 30, women should have HPV testing at the same frequency as a Pap test.  Colorectal cancer can be detected and often prevented. Most routine colorectal cancer screening begins at the age of 50 and continues through age 75. However, your caregiver may recommend screening at an earlier age if you have risk factors for colon cancer. On a yearly basis, your caregiver may provide home test kits to check for hidden blood in the stool. Use of a small camera at the end of a tube, to directly examine the colon (sigmoidoscopy or colonoscopy), can detect the earliest forms of colorectal cancer. Talk to your caregiver about this at age 50, when routine screening begins. Direct examination of the colon should be repeated every 5 to 10 years through age 75, unless early forms of pre-cancerous polyps or small growths are found.  Hepatitis C blood testing is recommended for all people born from 1945 through 1965 and any individual with known risks for hepatitis C.  Practice  safe sex. Use condoms and avoid high-risk sexual practices to reduce the spread of sexually transmitted infections (STIs). STIs include gonorrhea, chlamydia, syphilis, trichomonas, herpes, HPV, and human immunodeficiency virus (HIV). Herpes, HIV, and HPV are viral illnesses that have no cure. They can result in disability, cancer, and death. Sexually active women aged 25 and younger should be checked for chlamydia. Older women with new or multiple partners should also be tested for chlamydia. Testing for other STIs is recommended if you are sexually active and at increased risk.  Osteoporosis is a disease in which the bones lose minerals and strength with aging. This can result in serious bone fractures. The risk of osteoporosis can be identified using a bone density scan. Women ages 65 and over and women at risk for fractures or osteoporosis should discuss screening with their caregivers. Ask your caregiver whether you should take a calcium supplement or vitamin D to reduce the rate of osteoporosis.  Menopause can be associated with physical symptoms and risks. Hormone replacement therapy is available to decrease symptoms and risks. You should talk to your caregiver about whether hormone replacement therapy is right for you.  Use sunscreen with sun protection factor (SPF) of 30 or more. Apply sunscreen liberally and repeatedly throughout the day. You should seek shade when your shadow is shorter than you. Protect yourself by wearing long sleeves, pants, a wide-brimmed hat, and sunglasses year round, whenever you are outdoors.  Once a month, do a whole body skin exam, using a mirror to look at the skin on your back. Notify your caregiver of new moles, moles that have irregular borders, moles that are larger than a pencil eraser, or moles that have changed in shape or color.  Stay current with required immunizations.  Influenza. You need a dose every fall (or winter). The composition of the flu vaccine  changes each year, so being vaccinated once is not enough.  Pneumococcal polysaccharide. You need 1 to 2 doses if you smoke cigarettes or if you have certain chronic medical conditions. You need 1 dose at age 65 (or older) if you have never been vaccinated.  Tetanus, diphtheria, pertussis (Tdap, Td). Get 1 dose of Tdap vaccine if you are younger than age 65, are over 65 and have contact with an infant, are a healthcare worker, are pregnant, or simply want to be protected from whooping cough. After that, you need a Td   booster dose every 10 years. Consult your caregiver if you have not had at least 3 tetanus and diphtheria-containing shots sometime in your life or have a deep or dirty wound.  HPV. You need this vaccine if you are a woman age 26 or younger. The vaccine is given in 3 doses over 6 months.  Measles, mumps, rubella (MMR). You need at least 1 dose of MMR if you were born in 1957 or later. You may also need a second dose.  Meningococcal. If you are age 19 to 21 and a first-year college student living in a residence hall, or have one of several medical conditions, you need to get vaccinated against meningococcal disease. You may also need additional booster doses.  Zoster (shingles). If you are age 60 or older, you should get this vaccine.  Varicella (chickenpox). If you have never had chickenpox or you were vaccinated but received only 1 dose, talk to your caregiver to find out if you need this vaccine.  Hepatitis A. You need this vaccine if you have a specific risk factor for hepatitis A virus infection or you simply wish to be protected from this disease. The vaccine is usually given as 2 doses, 6 to 18 months apart.  Hepatitis B. You need this vaccine if you have a specific risk factor for hepatitis B virus infection or you simply wish to be protected from this disease. The vaccine is given in 3 doses, usually over 6 months. Preventive Services / Frequency Ages 19 to 39  Blood  pressure check.** / Every 1 to 2 years.  Lipid and cholesterol check.** / Every 5 years beginning at age 20.  Clinical breast exam.** / Every 3 years for women in their 20s and 30s.  Pap test.** / Every 2 years from ages 21 through 29. Every 3 years starting at age 30 through age 65 or 70 with a history of 3 consecutive normal Pap tests.  HPV screening.** / Every 3 years from ages 30 through ages 65 to 70 with a history of 3 consecutive normal Pap tests.  Hepatitis C blood test.** / For any individual with known risks for hepatitis C.  Skin self-exam. / Monthly.  Influenza immunization.** / Every year.  Pneumococcal polysaccharide immunization.** / 1 to 2 doses if you smoke cigarettes or if you have certain chronic medical conditions.  Tetanus, diphtheria, pertussis (Tdap, Td) immunization. / A one-time dose of Tdap vaccine. After that, you need a Td booster dose every 10 years.  HPV immunization. / 3 doses over 6 months, if you are 26 and younger.  Measles, mumps, rubella (MMR) immunization. / You need at least 1 dose of MMR if you were born in 1957 or later. You may also need a second dose.  Meningococcal immunization. / 1 dose if you are age 19 to 21 and a first-year college student living in a residence hall, or have one of several medical conditions, you need to get vaccinated against meningococcal disease. You may also need additional booster doses.  Varicella immunization.** / Consult your caregiver.  Hepatitis A immunization.** / Consult your caregiver. 2 doses, 6 to 18 months apart.  Hepatitis B immunization.** / Consult your caregiver. 3 doses usually over 6 months. Ages 40 to 64  Blood pressure check.** / Every 1 to 2 years.  Lipid and cholesterol check.** / Every 5 years beginning at age 20.  Clinical breast exam.** / Every year after age 40.  Mammogram.** / Every year beginning at age 40   and continuing for as long as you are in good health. Consult with your  caregiver.  Pap test.** / Every 3 years starting at age 30 through age 65 or 70 with a history of 3 consecutive normal Pap tests.  HPV screening.** / Every 3 years from ages 30 through ages 65 to 70 with a history of 3 consecutive normal Pap tests.  Fecal occult blood test (FOBT) of stool. / Every year beginning at age 50 and continuing until age 75. You may not need to do this test if you get a colonoscopy every 10 years.  Flexible sigmoidoscopy or colonoscopy.** / Every 5 years for a flexible sigmoidoscopy or every 10 years for a colonoscopy beginning at age 50 and continuing until age 75.  Hepatitis C blood test.** / For all people born from 1945 through 1965 and any individual with known risks for hepatitis C.  Skin self-exam. / Monthly.  Influenza immunization.** / Every year.  Pneumococcal polysaccharide immunization.** / 1 to 2 doses if you smoke cigarettes or if you have certain chronic medical conditions.  Tetanus, diphtheria, pertussis (Tdap, Td) immunization.** / A one-time dose of Tdap vaccine. After that, you need a Td booster dose every 10 years.  Measles, mumps, rubella (MMR) immunization. / You need at least 1 dose of MMR if you were born in 1957 or later. You may also need a second dose.  Varicella immunization.** / Consult your caregiver.  Meningococcal immunization.** / Consult your caregiver.  Hepatitis A immunization.** / Consult your caregiver. 2 doses, 6 to 18 months apart.  Hepatitis B immunization.** / Consult your caregiver. 3 doses, usually over 6 months. Ages 65 and over  Blood pressure check.** / Every 1 to 2 years.  Lipid and cholesterol check.** / Every 5 years beginning at age 20.  Clinical breast exam.** / Every year after age 40.  Mammogram.** / Every year beginning at age 40 and continuing for as long as you are in good health. Consult with your caregiver.  Pap test.** / Every 3 years starting at age 30 through age 65 or 70 with a 3  consecutive normal Pap tests. Testing can be stopped between 65 and 70 with 3 consecutive normal Pap tests and no abnormal Pap or HPV tests in the past 10 years.  HPV screening.** / Every 3 years from ages 30 through ages 65 or 70 with a history of 3 consecutive normal Pap tests. Testing can be stopped between 65 and 70 with 3 consecutive normal Pap tests and no abnormal Pap or HPV tests in the past 10 years.  Fecal occult blood test (FOBT) of stool. / Every year beginning at age 50 and continuing until age 75. You may not need to do this test if you get a colonoscopy every 10 years.  Flexible sigmoidoscopy or colonoscopy.** / Every 5 years for a flexible sigmoidoscopy or every 10 years for a colonoscopy beginning at age 50 and continuing until age 75.  Hepatitis C blood test.** / For all people born from 1945 through 1965 and any individual with known risks for hepatitis C.  Osteoporosis screening.** / A one-time screening for women ages 65 and over and women at risk for fractures or osteoporosis.  Skin self-exam. / Monthly.  Influenza immunization.** / Every year.  Pneumococcal polysaccharide immunization.** / 1 dose at age 65 (or older) if you have never been vaccinated.  Tetanus, diphtheria, pertussis (Tdap, Td) immunization. / A one-time dose of Tdap vaccine if you are over   65 and have contact with an infant, are a Research scientist (physical sciences), or simply want to be protected from whooping cough. After that, you need a Td booster dose every 10 years.  Varicella immunization.** / Consult your caregiver.  Meningococcal immunization.** / Consult your caregiver.  Hepatitis A immunization.** / Consult your caregiver. 2 doses, 6 to 18 months apart.  Hepatitis B immunization.** / Check with your caregiver. 3 doses, usually over 6 months. ** Family history and personal history of risk and conditions may change your caregiver's recommendations. Document Released: 04/07/2001 Document Revised: 05/04/2011  Document Reviewed: 07/07/2010 [facility] Patient Information 2014 [place], [place].  Thank you for enrolling in MyChart. Please follow the instructions below to securely access your online medical record. MyChart allows you to send messages to your doctor, view your test results, manage appointments, and more.   How Do I Sign Up? 1. In your Internet browser, go to [employer] and enter [email or web address]. 2. Click on the Sign Up Now link in the Sign In box. You will see the New Member Sign Up page. 3. Enter your MyChart Access Code exactly as it appears below. You will not need to use this code after you've completed the sign-up process. If you do not sign up before the expiration date, you must request a new code.  MyChart Access Code: [phone] Expires: 12/25/2012 10:40 AM  4. Enter your Social Security Number ([id number]) and Date of Birth (mm/dd/yyyy) as indicated and click Submit. You will be taken to the next sign-up page. 5. Create a MyChart ID. This will be your MyChart login ID and cannot be changed, so think of one that is secure and easy to remember. 6. Create a MyChart password. You can change your password at any time. 7. Enter your Password Reset Question and Answer. This can be used at a later time if you forget your password.  8. Enter your e-mail address. You will receive e-mail notification when new information is available in MyChart. 9. Click Sign Up. You can now view your medical record.   Additional Information Remember, MyChart is NOT to be used for urgent needs. For medical emergencies, dial 911.

## 2012-12-15 NOTE — Progress Notes (Signed)
Subjective:     Bailey Bonilla is a 37 y.o.G0 female and is here for a comprehensive gynecologic physical exam. The patient reports  Having a CT scan that showed cystic lesions in R and L adnexa, she is worried about this given history of pancreatic cysts. No pain. Sexually active, no concerns.  Not using any contraception.  History   Social History  . Marital Status: Single    Spouse Name: N/A    Number of Children: N/A  . Years of Education: N/A   Occupational History  . Not on file.   Social History Main Topics  . Smoking status: Never Smoker   . Smokeless tobacco: Not on file  . Alcohol Use: No  . Drug Use: No  . Sexual Activity: Not on file   Other Topics Concern  . Not on file   Social History Narrative  . No narrative on file   Health Maintenance  Topic Date Due  . Pap Smear  10/30/1994  . Tetanus/tdap  10/30/1995  . Influenza Vaccine  09/23/2013    The following portions of the patient's history were reviewed and updated as appropriate: allergies, current medications, past family history, past medical history, past social history, past surgical history and problem list.  Review of Systems Pertinent items are noted in HPI.   Objective:   BP 134/93  Pulse 86  Temp(Src) 97.8 F (36.6 C) (Oral)  Ht 5\' 10"  (1.778 m)  Wt 292 lb 11.2 oz (132.768 kg)  BMI 42 kg/m2  LMP 11/28/2012 GENERAL: Well-developed, well-nourished female in no acute distress.  HEENT: Normocephalic, atraumatic. Sclerae anicteric.  NECK: Supple. Normal thyroid.  LUNGS: Clear to auscultation bilaterally.  HEART: Regular rate and rhythm. BREASTS: Symmetric in size. No masses, skin changes, nipple drainage, or lymphadenopathy. ABDOMEN: Soft, nontender, nondistended. No organomegaly.  Well-healed postsurgical scars. PELVIC: Normal external female genitalia. Vagina is pink and rugated.  Normal discharge. Normal cervix contour. Pap smear obtained. Uterus is normal in size. No adnexal mass or  tenderness.  EXTREMITIES: No cyanosis, clubbing, or edema, 2+ distal pulses.  Imaging 10/17/2012   CT ABDOMEN AND PELVIS WITH CONTRAST  Clinical Data: History of splenectomy and pancreatic cyst removal. Surgery in 2012.  Incisional hernia repair 09/2011.   Findings: Lung bases:  Minimal motion degradation. Normal heart size without pericardial or pleural effusion.  Abdomen/pelvis:  Normal liver.  Status post splenectomy.  Resection of the pancreatic body and tail.  Pancreatic neck and head are normal in appearance, without pancreatic ductal dilatation.  Normal gallbladder, biliary tract, adrenal glands, kidneys.  No retroperitoneal or retrocrural adenopathy.  Colonic stool burden suggests constipation.  Normal small bowel without abdominal ascites.  There is a pericolonic nodule in the anterior right abdomen which measures 1.0 cm on image 41/series 2.  A nodule in the small bowel mesentery or transverse mesocolon measures 1.0 cm on image 44/series 2.  No pelvic adenopathy.  Normal urinary bladder.  Fluid versus mild endometrial thickening within the central uterus.  1.7 cm.  Uterus retroverted.  Right ovarian cystic lesion of 5.6 cm on image 75/series 2.  Fluid density in the left hemi pelvis is suspicious for hydrosalpinx when correlated with reformatted images. No significant free fluid.  Bones/Musculoskeletal:  Prior ventral abdominal wall hernia repair, without recurrence. No acute osseous abnormality.  IMPRESSION:  1.  Status post splenectomy and partial pancreatectomy. 2.  Mesenteric versus peritoneal nodularity.  If the patients pancreatic "cyst" had malignant features, peritoneal/mesenteric nodal metastasis would be  difficult to exclude.  Short-term imaging follow-up versus further characterization with PET would be suggested.  If not, these nodules are likely reactive. 3.  Bilateral pelvic abnormalities, including dominant right ovarian cystic lesion and possible left hydrosalpinx.  Consider further  characterization with pelvic ultrasound. 4.  Endometrial fluid versus mild thickening.  This would also be better evaluated with pelvic ultrasound. 5. Possible constipation.   Original Report Authenticated By: Jeronimo Greaves, M.D.   Assessment:    Healthy female exam. Pelvic cysts on CT scan   Plan:   Pelvic ultrasound ordered for further characterization.  Patient was assured this likely is not related to her pancreatic cysts. Pap and STI screen done, will follow up results and manage accordingly. Routine preventative health maintenance measures emphasized

## 2012-12-16 LAB — HIV ANTIBODY (ROUTINE TESTING W REFLEX): HIV: NONREACTIVE

## 2012-12-16 LAB — HEPATITIS C ANTIBODY: HCV Ab: NEGATIVE

## 2012-12-16 LAB — RPR

## 2012-12-20 ENCOUNTER — Ambulatory Visit (HOSPITAL_COMMUNITY): Payer: No Typology Code available for payment source

## 2012-12-26 ENCOUNTER — Ambulatory Visit (HOSPITAL_COMMUNITY): Payer: No Typology Code available for payment source

## 2013-01-06 ENCOUNTER — Ambulatory Visit (HOSPITAL_COMMUNITY)
Admission: RE | Admit: 2013-01-06 | Discharge: 2013-01-06 | Disposition: A | Discharge status: Home / Self Care | Payer: No Typology Code available for payment source | Source: Ambulatory Visit | Attending: Obstetrics & Gynecology | Admitting: Obstetrics & Gynecology

## 2013-01-06 DIAGNOSIS — N83209 Unspecified ovarian cyst, unspecified side: Secondary | ICD-10-CM | POA: Insufficient documentation

## 2013-01-06 DIAGNOSIS — IMO0002 Reserved for concepts with insufficient information to code with codable children: Secondary | ICD-10-CM

## 2013-01-06 DIAGNOSIS — N9489 Other specified conditions associated with female genital organs and menstrual cycle: Secondary | ICD-10-CM | POA: Insufficient documentation

## 2013-01-06 DIAGNOSIS — D259 Leiomyoma of uterus, unspecified: Secondary | ICD-10-CM | POA: Insufficient documentation

## 2013-01-10 ENCOUNTER — Telehealth: Payer: Self-pay | Admitting: *Deleted

## 2013-01-10 NOTE — Telephone Encounter (Signed)
Message copied by Dorothyann Peng on Tue Jan 10, 2013  2:12 PM ------      Message from: Jaynie Collins A      Created: Mon Jan 09, 2013  1:23 PM       6 cm right cyst seen on ultrasound; very tiny cysts seen on left ovary.  Please call to inform patient of results.  Cyst appears benign but she can come in to discuss management if she is symptomatic. Advise to go to the ER with any concerning symptoms. ------

## 2013-01-10 NOTE — Telephone Encounter (Signed)
01/10/2013:  Message to call pt in results tab, called pt and left message on voicemail for her to return call to clinic.

## 2013-01-10 NOTE — Telephone Encounter (Signed)
Pt called and I informed her of the 6 cm cyst on right side and the tiny cysts on her left ovary and if she discuss further management then to call and schedule an appt.  Pt stated understanding and had no further questions.

## 2013-01-27 ENCOUNTER — Telehealth: Payer: Self-pay

## 2013-01-27 NOTE — Telephone Encounter (Signed)
Patient called to verify her potassium dose States the new rx states to take BID Per Dr Susie Cassette should only take one 20 meq daily and  Return in about a week for potassium level Patient stated she will call to schedule blood work

## 2013-03-02 ENCOUNTER — Encounter (HOSPITAL_COMMUNITY): Payer: Self-pay | Admitting: Emergency Medicine

## 2013-03-02 ENCOUNTER — Emergency Department (HOSPITAL_COMMUNITY)
Admission: EM | Admit: 2013-03-02 | Discharge: 2013-03-02 | Disposition: A | Discharge status: Home / Self Care | Payer: No Typology Code available for payment source | Source: Home / Self Care | Attending: Family Medicine | Admitting: Family Medicine

## 2013-03-02 DIAGNOSIS — K089 Disorder of teeth and supporting structures, unspecified: Secondary | ICD-10-CM

## 2013-03-02 DIAGNOSIS — K0889 Other specified disorders of teeth and supporting structures: Secondary | ICD-10-CM

## 2013-03-02 MED ORDER — DICLOFENAC POTASSIUM 50 MG PO TABS: 50.0000 mg | ORAL_TABLET | Freq: Three times a day (TID) | ORAL | Status: DC

## 2013-03-02 MED ORDER — AMOXICILLIN 500 MG PO CAPS: 500.0000 mg | ORAL_CAPSULE | Freq: Three times a day (TID) | ORAL | Status: DC

## 2013-03-02 NOTE — Discharge Instructions (Signed)
Take medicine as prescribed, see your dentist as soon as possible °

## 2013-03-02 NOTE — ED Notes (Signed)
Seen by dr Juventino Slovak prior to this nurse

## 2013-03-02 NOTE — ED Provider Notes (Signed)
CSN: [id number]     Arrival date & time 03/02/13  1815 History   First MD Initiated Contact with Patient 03/02/13 2004     No chief complaint on file.  (Consider location/radiation/quality/duration/timing/severity/associated sxs/prior Treatment) Patient is a 38 y.o. female presenting with tooth pain.  Dental Pain Location:  Upper Upper teeth location:  14/LU 1st molar Quality:  Throbbing Severity:  Moderate Onset quality:  Gradual Duration:  2 weeks Progression:  Worsening Chronicity:  New Context: dental caries, dental fracture and poor dentition   Associated symptoms: facial pain   Associated symptoms: no facial swelling and no fever   Risk factors: sufficient dental care     Past Medical History  Diagnosis Date  . Hypertension   . Diabetes mellitus without complication   . H/O splenectomy   . 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 Knee surgery      right knee, 1995, acl  . Splenectomy, partial      2012  . Hernia repair      2013   No family history on file. History  Substance Use Topics  . Smoking status: Never Smoker   . Smokeless tobacco: Not on file  . Alcohol Use: No   OB History   Grav Para Term Preterm Abortions TAB SAB Ect Mult Living   0 0 0 0 0 0 0 0 0 0      Review of Systems  Constitutional: Negative.  Negative for fever.  HENT: Positive for dental problem. Negative for facial swelling.     Allergies  Review of patient's allergies indicates no known allergies.  Home Medications   Current Outpatient Rx  Name  Route  Sig  Dispense  Refill  . amoxicillin (AMOXIL) 500 MG capsule   Oral   Take 1 capsule (500 mg total) by mouth 3 (three) times daily.   30 capsule   0   . diclofenac (CATAFLAM) 50 MG tablet   Oral   Take 1 tablet (50 mg total) by mouth 3 (three) times daily. For tooth pain   21 tablet   0   . folic acid (FOLVITE) 073 MCG tablet   Oral   Take 400 mcg by mouth  daily.         . hydrochlorothiazide (HYDRODIURIL) 25 MG tablet   Oral   Take 1 tablet (25 mg total) by mouth daily.   60 tablet   2   . metFORMIN (GLUCOPHAGE) 500 MG tablet   Oral   Take 2 tablets (1,000 mg total) by mouth 2 (two) times daily.   30 tablet   2   . methyldopa (ALDOMET) 250 MG tablet   Oral   Take 1 tablet (250 mg total) by mouth 2 (two) times daily.   60 tablet   2   . potassium chloride (MICRO-K) 10 MEQ CR capsule   Oral   Take 10 mEq by mouth daily.         . potassium chloride SA (K-DUR,KLOR-CON) 20 MEQ tablet   Oral   Take 1 tablet (20 mEq total) by mouth 2 (two) times daily.   60 tablet   2   . vitamin C (ASCORBIC ACID) 500 MG tablet   Oral   Take 500 mg by mouth daily.          BP 135/71  Pulse 87  Temp(Src) 98.2 F (36.8 C) (Oral)  Resp 18  SpO2 100% Physical Exam  Nursing note and vitals reviewed. Constitutional: She appears well-developed and well-nourished.  HENT:  Right Ear: External ear normal.  Left Ear: External ear normal.  Mouth/Throat: Uvula is midline, oropharynx is clear and moist and mucous membranes are normal. Abnormal dentition. Dental caries present.    Eyes: Pupils are equal, round, and reactive to light.  Neck: Normal range of motion. Neck supple.  Lymphadenopathy:    She has no cervical adenopathy.    ED Course  Procedures (including critical care time) Labs Review Labs Reviewed - No data to display Imaging Review No results found.  EKG Interpretation    Date/Time:    Ventricular Rate:    PR Interval:    QRS Duration:   QT Interval:    QTC Calculation:   R Axis:     Text Interpretation:              MDM      Billy Fischer, MD 03/02/13 2016

## 2013-03-24 ENCOUNTER — Encounter: Payer: Self-pay | Admitting: Internal Medicine

## 2013-03-24 ENCOUNTER — Ambulatory Visit: Payer: No Typology Code available for payment source | Attending: Internal Medicine | Admitting: Internal Medicine

## 2013-03-24 VITALS — BP 135/90 | HR 78 | Temp 98.9°F | Resp 16 | Ht 70.0 in | Wt 288.0 lb

## 2013-03-24 DIAGNOSIS — E119 Type 2 diabetes mellitus without complications: Secondary | ICD-10-CM | POA: Insufficient documentation

## 2013-03-24 DIAGNOSIS — I10 Essential (primary) hypertension: Secondary | ICD-10-CM | POA: Insufficient documentation

## 2013-03-24 DIAGNOSIS — K089 Disorder of teeth and supporting structures, unspecified: Secondary | ICD-10-CM | POA: Insufficient documentation

## 2013-03-24 LAB — CBC WITH DIFFERENTIAL/PLATELET
Basophils Absolute: 0 10*3/uL (ref 0.0–0.1)
Basophils Relative: 1 % (ref 0–1)
EOS ABS: 0.1 10*3/uL (ref 0.0–0.7)
EOS PCT: 1 % (ref 0–5)
HCT: 30.3 % — ABNORMAL LOW (ref 36.0–46.0)
Hemoglobin: 9.6 g/dL — ABNORMAL LOW (ref 12.0–15.0)
LYMPHS ABS: 3 10*3/uL (ref 0.7–4.0)
LYMPHS PCT: 35 % (ref 12–46)
MCH: 25.5 pg — AB (ref 26.0–34.0)
MCHC: 31.7 g/dL (ref 30.0–36.0)
MCV: 80.6 fL (ref 78.0–100.0)
MONO ABS: 0.7 10*3/uL (ref 0.1–1.0)
MONOS PCT: 8 % (ref 3–12)
Neutro Abs: 4.9 10*3/uL (ref 1.7–7.7)
Neutrophils Relative %: 55 % (ref 43–77)
Platelets: 552 10*3/uL — ABNORMAL HIGH (ref 150–400)
RBC: 3.76 MIL/uL — AB (ref 3.87–5.11)
RDW: 18.5 % — ABNORMAL HIGH (ref 11.5–15.5)
WBC: 8.7 10*3/uL (ref 4.0–10.5)

## 2013-03-24 LAB — LIPID PANEL
CHOL/HDL RATIO: 3.4 ratio
Cholesterol: 131 mg/dL (ref 0–200)
HDL: 38 mg/dL — ABNORMAL LOW (ref >39)
LDL CALC: 80 mg/dL (ref 0–99)
TRIGLYCERIDES: 65 mg/dL (ref <150)
VLDL: 13 mg/dL (ref 0–40)

## 2013-03-24 LAB — POCT GLYCOSYLATED HEMOGLOBIN (HGB A1C): Hemoglobin A1C: 7.3

## 2013-03-24 LAB — COMPLETE METABOLIC PANEL WITH GFR
ALK PHOS: 68 U/L (ref 39–117)
ALT: 13 U/L (ref 0–35)
AST: 11 U/L (ref 0–37)
Albumin: 4 g/dL (ref 3.5–5.2)
BUN: 9 mg/dL (ref 6–23)
CO2: 28 meq/L (ref 19–32)
CREATININE: 0.77 mg/dL (ref 0.50–1.10)
Calcium: 8.9 mg/dL (ref 8.4–10.5)
Chloride: 102 mEq/L (ref 96–112)
Glucose, Bld: 136 mg/dL — ABNORMAL HIGH (ref 70–99)
Potassium: 3.8 mEq/L (ref 3.5–5.3)
SODIUM: 139 meq/L (ref 135–145)
TOTAL PROTEIN: 6.8 g/dL (ref 6.0–8.3)
Total Bilirubin: 0.7 mg/dL (ref 0.2–1.2)

## 2013-03-24 LAB — URIC ACID: Uric Acid, Serum: 4.8 mg/dL (ref 2.4–7.0)

## 2013-03-24 LAB — GLUCOSE, POCT (MANUAL RESULT ENTRY): POC Glucose: 126 mg/dl — AB (ref 70–99)

## 2013-03-24 MED ORDER — TRAMADOL HCL 50 MG PO TABS: 50.0000 mg | ORAL_TABLET | Freq: Three times a day (TID) | ORAL | Status: DC | PRN

## 2013-03-24 MED ORDER — HYDROCHLOROTHIAZIDE 25 MG PO TABS
25.0000 mg | ORAL_TABLET | Freq: Every day | ORAL | Status: DC
Start: 2013-03-24 — End: 2013-10-19

## 2013-03-24 MED ORDER — AMOXICILLIN-POT CLAVULANATE 875-125 MG PO TABS: 1.0000 | ORAL_TABLET | Freq: Two times a day (BID) | ORAL | Status: DC

## 2013-03-24 MED ORDER — METHYLDOPA 250 MG PO TABS: 250.0000 mg | ORAL_TABLET | Freq: Two times a day (BID) | ORAL | Status: DC

## 2013-03-24 MED ORDER — METFORMIN HCL 500 MG PO TABS: 1000.0000 mg | ORAL_TABLET | Freq: Two times a day (BID) | ORAL | Status: DC

## 2013-03-24 MED ORDER — METFORMIN HCL 1000 MG PO TABS: 1000.0000 mg | ORAL_TABLET | Freq: Two times a day (BID) | ORAL | Status: DC

## 2013-03-24 MED ORDER — GLUCOSE BLOOD VI STRP: ORAL_STRIP | Status: AC

## 2013-03-24 NOTE — Progress Notes (Signed)
Pt is here today because she needs a dentist referral. She chipped one of her molars around Christmas time and she has been in constant pain. Pt's cycles have been inconsistent. Pt has to use a brace on her left ankle to get around with less pain and now her 5th toe has been having numbness and feels strange.

## 2013-03-24 NOTE — Progress Notes (Signed)
Patient ID: Bailey Bonilla, female   DOB: 12-12-1975, 38 y.o.   MRN: [id number]   CC:  HPI: 38 year old female here for a followup. The patient has left upper molar pain, was prescribed amoxicillin at urgent care. She has completed the course of her antibiotic, is yet to receive a dentist referral. The patient complains of difficulty chewing, she has some facial swelling which has not subsided despite antibiotics. No fever, CBG well-controlled Diabetes patient except that she does not check her sugar on a regular basis because she cannot afford the lancets  She also complains of right ankle pain which is chronic, she's had plain x-rays which have been negative in the past  She also reviewed her abdominal CT scan results with Dr.ahuja in [facility] and was reassured by her back she did not any intervention at this time, probably need to repeat another CAT scan in a few months.    No Known Allergies Past Medical History  Diagnosis Date  . Hypertension   . Diabetes mellitus without complication   . H/O splenectomy   . 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 folic acid (FOLVITE) 563 MCG tablet Take 400 mcg by mouth daily.      . potassium chloride SA (K-DUR,KLOR-CON) 20 MEQ tablet Take 1 tablet (20 mEq total) by mouth 2 (two) times daily.  60 tablet  2  . vitamin C (ASCORBIC ACID) 500 MG tablet Take 500 mg by mouth daily.      Marland Kitchen amoxicillin (AMOXIL) 500 MG capsule Take 1 capsule (500 mg total) by mouth 3 (three) times daily.  30 capsule  0  . potassium chloride (MICRO-K) 10 MEQ CR capsule Take 10 mEq by mouth daily.       No current facility-administered medications on file prior to visit.   Family History  Problem Relation Age of Onset  . Diabetes Father   . Diabetes Paternal Aunt   . Cancer Paternal Aunt    History   Social History  . Marital Status: Single    Spouse Name: N/A     Number of Children: N/A  . Years of Education: N/A   Occupational History  . Not on file.   Social History Main Topics  . Smoking status: Never Smoker   . Smokeless tobacco: Not on file  . Alcohol Use: No  . Drug Use: No  . Sexual Activity: Not on file   Other Topics Concern  . Not on file   Social History Narrative  . No narrative on file    Review of Systems  Constitutional: Negative for fever, chills, diaphoresis, activity change, appetite change and fatigue.  HENT: Negative for ear pain, nosebleeds, congestion, facial swelling, rhinorrhea, neck pain, neck stiffness and ear discharge.   Eyes: Negative for pain, discharge, redness, itching and visual disturbance.  Respiratory: Negative for cough, choking, chest tightness, shortness of breath, wheezing and stridor.   Cardiovascular: Negative for chest pain, palpitations and leg swelling.  Gastrointestinal: Negative for abdominal distention.  Genitourinary: Negative for dysuria, urgency, frequency, hematuria, flank pain, decreased urine volume, difficulty urinating and dyspareunia.  Musculoskeletal: Negative for back pain, joint swelling, arthralgias and gait problem.  Neurological: Negative for dizziness, tremors, seizures, syncope, facial asymmetry, speech difficulty, weakness, light-headedness, numbness and headaches.  Hematological: Negative for adenopathy. Does not bruise/bleed easily.  Psychiatric/Behavioral: Negative for hallucinations, behavioral problems, confusion, dysphoric mood, decreased concentration and agitation.  Objective:   Filed Vitals:   03/24/13 1500  BP: 135/90  Pulse: 78  Temp: 98.9 F (37.2 C)  Resp: 16    Physical Exam  Constitutional: Appears well-developed and well-nourished. No distress.  HENT: Normocephalic. External right and left ear normal. Oropharynx is clear and moist.  Eyes: Conjunctivae and EOM are normal. PERRLA, no scleral icterus.  Neck: Normal ROM. Neck supple. No JVD. No  tracheal deviation. No thyromegaly.  CVS: RRR, S1/S2 +, no murmurs, no gallops, no carotid bruit.  Pulmonary: Effort and breath sounds normal, no stridor, rhonchi, wheezes, rales.  Abdominal: Soft. BS +,  no distension, tenderness, rebound or guarding.  Musculoskeletal: Normal range of motion. No edema and no tenderness.  Lymphadenopathy: No lymphadenopathy noted, cervical, inguinal. Neuro: Alert. Normal reflexes, muscle tone coordination. No cranial nerve deficit. Skin: Skin is warm and dry. No rash noted. Not diaphoretic. No erythema. No pallor.  Psychiatric: Normal mood and affect. Behavior, judgment, thought content normal.   Lab Results  Component Value Date   WBC 8.0 10/14/2012   HGB 10.9* 10/14/2012   HCT 34.1* 10/14/2012   MCV 82.4 10/14/2012   PLT 571* 10/14/2012   Lab Results  Component Value Date   CREATININE 0.88 10/14/2012   BUN 10 10/14/2012   NA 137 10/14/2012   K 3.7 10/14/2012   CL 100 10/14/2012   CO2 30 10/14/2012    Lab Results  Component Value Date   HGBA1C 7.3* 10/14/2012   Lipid Panel     Component Value Date/Time   CHOL 145 10/14/2012 1106   TRIG 72 10/14/2012 1106   HDL 42 10/14/2012 1106   CHOLHDL 3.5 10/14/2012 1106   VLDL 14 10/14/2012 1106   LDLCALC 89 10/14/2012 1106       Assessment and plan:   Patient Active Problem List   Diagnosis Date Noted  . H/O resection of pancreas 10/26/2012  . DM2 (diabetes mellitus, type 2) 10/26/2012       Dental pain Dentist referral Patient to receive a prescription for Augmentin that she will start before her dentist appointment  Status post partial pancreatectomy and splenectomy  - Done Belva Bertin for a benign cystic lesion last year  -She need a influenza shot every year, She will require Pneumovax shot once every 5 years next DUE in 2018, HIM shot once which she received prior to her splenic resection   Diabetes mellitus Continue metformin, A1c, CBG today 127  Hypertension blood pressure stable Check  renal panel today   Health maintenance Due for Pap smear in October 2015   The patient was given clear instructions to go to ER or return to medical center if symptoms don't improve, worsen or new problems develop. The patient verbalized understanding. The patient was told to call to get any lab results if not heard anything in the next week.

## 2013-03-30 ENCOUNTER — Telehealth: Payer: Self-pay | Admitting: *Deleted

## 2013-03-30 NOTE — Telephone Encounter (Signed)
Message copied by Anett Ranker, [place] R on Thu Mar 30, 2013  4:48 PM ------      Message from: Allyson Sabal MD, [facility]      Created: Tue Mar 28, 2013  2:11 PM       Notify patient of the patient's hemoglobin is 9.6, she should start taking a multivitamin over-the-counter ------

## 2013-04-04 ENCOUNTER — Ambulatory Visit (HOSPITAL_COMMUNITY): Admission: RE | Admit: 2013-04-04 | Payer: Managed Care, Other (non HMO) | Source: Ambulatory Visit

## 2013-04-06 ENCOUNTER — Ambulatory Visit: Payer: Self-pay | Admitting: Family Medicine

## 2013-04-12 ENCOUNTER — Ambulatory Visit (HOSPITAL_COMMUNITY): Payer: Managed Care, Other (non HMO)

## 2013-04-13 ENCOUNTER — Ambulatory Visit: Payer: Self-pay | Admitting: Family Medicine

## 2013-04-21 ENCOUNTER — Ambulatory Visit: Payer: Managed Care, Other (non HMO) | Attending: Internal Medicine

## 2013-04-28 ENCOUNTER — Telehealth: Payer: Self-pay | Admitting: Internal Medicine

## 2013-04-28 NOTE — Telephone Encounter (Signed)
Pt called regarding an antibiotic she was taking, pt states that the antibiotics has given her a yeast infection and would like for the doctor to perscribe her something for her yeast infection. Please contact pt

## 2013-04-29 ENCOUNTER — Ambulatory Visit (HOSPITAL_BASED_OUTPATIENT_CLINIC_OR_DEPARTMENT_OTHER)
Admission: RE | Admit: 2013-04-29 | Discharge: 2013-04-29 | Disposition: A | Discharge status: Home / Self Care | Payer: No Typology Code available for payment source | Source: Ambulatory Visit | Attending: Internal Medicine | Admitting: Internal Medicine

## 2013-04-29 DIAGNOSIS — E119 Type 2 diabetes mellitus without complications: Secondary | ICD-10-CM | POA: Insufficient documentation

## 2013-05-01 ENCOUNTER — Telehealth: Payer: Self-pay | Admitting: Emergency Medicine

## 2013-05-01 NOTE — Telephone Encounter (Signed)
Left message for pt to call clinic

## 2013-05-02 ENCOUNTER — Ambulatory Visit (INDEPENDENT_AMBULATORY_CARE_PROVIDER_SITE_OTHER): Payer: No Typology Code available for payment source | Admitting: Family Medicine

## 2013-05-02 ENCOUNTER — Encounter: Payer: Self-pay | Admitting: Family Medicine

## 2013-05-02 ENCOUNTER — Telehealth: Payer: Self-pay | Admitting: Internal Medicine

## 2013-05-02 ENCOUNTER — Telehealth: Payer: Self-pay | Admitting: *Deleted

## 2013-05-02 ENCOUNTER — Telehealth: Payer: Self-pay | Admitting: Emergency Medicine

## 2013-05-02 VITALS — BP 108/73 | HR 83 | Ht 70.0 in | Wt 285.0 lb

## 2013-05-02 DIAGNOSIS — M79609 Pain in unspecified limb: Secondary | ICD-10-CM

## 2013-05-02 DIAGNOSIS — M79671 Pain in right foot: Secondary | ICD-10-CM

## 2013-05-02 DIAGNOSIS — M2140 Flat foot [pes planus] (acquired), unspecified foot: Secondary | ICD-10-CM

## 2013-05-02 DIAGNOSIS — M25571 Pain in right ankle and joints of right foot: Secondary | ICD-10-CM

## 2013-05-02 DIAGNOSIS — M25579 Pain in unspecified ankle and joints of unspecified foot: Secondary | ICD-10-CM

## 2013-05-02 MED ORDER — FLUCONAZOLE 150 MG PO TABS: 150.0000 mg | ORAL_TABLET | Freq: Once | ORAL | Status: DC

## 2013-05-02 NOTE — Telephone Encounter (Signed)
Pt given MRI results- states she went to Sports medicine appt today for tear in tendon in back of leg.  Pt referred to PT. Pt also requesting Diflucan for yeast infection s/p ATB treatment Medicine sent to our pharmacy

## 2013-05-02 NOTE — Telephone Encounter (Signed)
Message copied by Ricci Barker on Tue May 02, 2013  4:56 PM ------      Message from: Allyson Sabal MD, [facility]      Created: Tue May 02, 2013  2:14 PM       Patient's MRI of the right foot is abnormal. She has inflammation of the Achilles tendon, she also has a tear in her tendon in the back of the leg. She does need a referral to sports medicine for right foot pain and abnormal MRI. Further explanation of the MRI findings needs to be provided by a sports medicine doctor. ------

## 2013-05-02 NOTE — Telephone Encounter (Signed)
Left message

## 2013-05-02 NOTE — Patient Instructions (Signed)
Your primary diagnosis is posterior tibialis split tear and tendinopathy. Start physical therapy and do home exercises on days you don't go to therapy. Wear sports insoles with scaphoid pads - switch in and out of different shoes. Avoid barefoot walking even around the house. Ankle brace for support when up and walking around also. Icing 15 minutes at a time 3-4 times a day. Aleve or ibuprofen as needed for pain, inflammation. Follow up with me in 6 weeks for reevaluation. Consider nitro patches, custom orthotics, injection if not improving.

## 2013-05-02 NOTE — Telephone Encounter (Signed)
Pt returning nurse call

## 2013-05-03 ENCOUNTER — Encounter: Payer: Self-pay | Admitting: Family Medicine

## 2013-05-03 DIAGNOSIS — M2140 Flat foot [pes planus] (acquired), unspecified foot: Secondary | ICD-10-CM | POA: Insufficient documentation

## 2013-05-03 DIAGNOSIS — M25571 Pain in right ankle and joints of right foot: Secondary | ICD-10-CM | POA: Insufficient documentation

## 2013-05-03 NOTE — Progress Notes (Signed)
Patient ID: Bailey Bonilla, female   DOB: May 09, 1975, 38 y.o.   MRN: [id number]  PCP: Angelica Chessman, MD  Subjective:   HPI: Patient is a 38 y.o. female here for right ankle injury.  Patient reports pain started a year ago day after playing laser tag and bowling. No injury with these. Developed pain and swelling mostly medial ankle. A week later bowling - day after same issue came back. Has tried bracing, ibuprofen, pain medication. MRI showed possible tendinopathy of achilles, split tear of posterior tibialis and sprain of spring ligament.  Past Medical History  Diagnosis Date  . Hypertension   . Diabetes mellitus without complication   . H/O splenectomy   . 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 folic acid (FOLVITE) 417 MCG tablet Take 400 mcg by mouth daily.      Marland Kitchen glucose blood test strip Use as instructed  250 each  12  . hydrochlorothiazide (HYDRODIURIL) 25 MG tablet Take 1 tablet (25 mg total) by mouth daily.  60 tablet  3  . metFORMIN (GLUCOPHAGE) 1000 MG tablet Take 1 tablet (1,000 mg total) by mouth 2 (two) times daily.  120 tablet  3  . methyldopa (ALDOMET) 250 MG tablet Take 1 tablet (250 mg total) by mouth 2 (two) times daily.  60 tablet  3  . potassium chloride (MICRO-K) 10 MEQ CR capsule Take 10 mEq by mouth daily.      . potassium chloride SA (K-DUR,KLOR-CON) 20 MEQ tablet Take 1 tablet (20 mEq total) by mouth 2 (two) times daily.  60 tablet  2  . traMADol (ULTRAM) 50 MG tablet Take 1 tablet (50 mg total) by mouth every 8 (eight) hours as needed.  40 tablet  0  . vitamin C (ASCORBIC ACID) 500 MG tablet Take 500 mg by mouth daily.       No current facility-administered medications on file prior to visit.    Past Surgical History  Procedure Laterality Date  . Knee surgery      right knee, 1995, acl  . Splenectomy, partial      2012  . Hernia repair     2013    No Known Allergies  History   Social History  . Marital Status: Single    Spouse Name: N/A    Number of Children: N/A  . Years of Education: N/A   Occupational History  . Not on file.   Social History Main Topics  . Smoking status: Never Smoker   . Smokeless tobacco: Not on file  . Alcohol Use: No  . Drug Use: No  . Sexual Activity: Not on file   Other Topics Concern  . Not on file   Social History Narrative  . No narrative on file    Family History  Problem Relation Age of Onset  . Diabetes Father   . Hypertension Father   . Diabetes Paternal Aunt   . Cancer Paternal Aunt   . Hypertension Mother   . Hypertension Sister   . Hyperlipidemia Sister     BP 108/73  Pulse 83  Ht 5\' 10"  (1.778 m)  Wt 285 lb (129.275 kg)  BMI 40.89 kg/m2  LMP 04/20/2013  Review of Systems: See HPI above.    Objective:  Physical Exam:  Gen: NAD  Right ankle/foot: Mild diffuse swelling.  No bruising, other deformity. Significant pes planus. FROM ankle with pain  on internal rotation. TTP along course of post tibialis.  No other TTP including achilles. Negative ant drawer and talar tilt.   Negative syndesmotic compression. Thompsons test negative. NV intact distally.    Assessment & Plan:  1. Right ankle pain - 2/2 posterior tibialis split tendon tear, tendinopathy.  Start physical therapy and home exercises.  Sports insoles provided with scaphoid pads for better arch support.  No barefoot walking or flat shoes.  Icing, nsaids.  F/u in 6 weeks.  Consider nitro patches, custom orthotics, injection if not improving.

## 2013-05-03 NOTE — Assessment & Plan Note (Signed)
2/2 posterior tibialis split tendon tear, tendinopathy.  Start physical therapy and home exercises.  Sports insoles provided with scaphoid pads for better arch support.  No barefoot walking or flat shoes.  Icing, nsaids.  F/u in 6 weeks.  Consider nitro patches, custom orthotics, injection if not improving.

## 2013-05-12 ENCOUNTER — Telehealth: Payer: Self-pay | Admitting: Internal Medicine

## 2013-05-12 NOTE — Telephone Encounter (Signed)
Pt. Needs doctor's note for Gym, according to pt. Her MRI results she has a torn tendon on her ankle. Please call pt.

## 2013-05-15 NOTE — Telephone Encounter (Signed)
Spoke with pt in regards to gym note. Pt requesting a note to get released from gym membership. Informed pt, she needs to f/u with PT or sports med for letter. Also informed if there are any problems give me a call.

## 2013-05-19 ENCOUNTER — Ambulatory Visit: Payer: No Typology Code available for payment source | Admitting: Physical Therapy

## 2013-05-23 ENCOUNTER — Ambulatory Visit: Payer: No Typology Code available for payment source | Attending: Family Medicine | Admitting: Physical Therapy

## 2013-05-23 DIAGNOSIS — IMO0001 Reserved for inherently not codable concepts without codable children: Secondary | ICD-10-CM | POA: Insufficient documentation

## 2013-05-23 DIAGNOSIS — R609 Edema, unspecified: Secondary | ICD-10-CM | POA: Insufficient documentation

## 2013-05-23 DIAGNOSIS — M6281 Muscle weakness (generalized): Secondary | ICD-10-CM | POA: Insufficient documentation

## 2013-05-23 DIAGNOSIS — M25579 Pain in unspecified ankle and joints of unspecified foot: Secondary | ICD-10-CM | POA: Insufficient documentation

## 2013-05-23 DIAGNOSIS — R269 Unspecified abnormalities of gait and mobility: Secondary | ICD-10-CM | POA: Insufficient documentation

## 2013-05-24 ENCOUNTER — Ambulatory Visit: Payer: Self-pay | Admitting: Internal Medicine

## 2013-05-26 ENCOUNTER — Emergency Department (HOSPITAL_BASED_OUTPATIENT_CLINIC_OR_DEPARTMENT_OTHER)
Admission: EM | Admit: 2013-05-26 | Discharge: 2013-05-26 | Disposition: A | Discharge status: Home / Self Care | Payer: Managed Care, Other (non HMO) | Attending: Emergency Medicine | Admitting: Emergency Medicine

## 2013-05-26 ENCOUNTER — Encounter (HOSPITAL_BASED_OUTPATIENT_CLINIC_OR_DEPARTMENT_OTHER): Payer: Self-pay | Admitting: Emergency Medicine

## 2013-05-26 DIAGNOSIS — K089 Disorder of teeth and supporting structures, unspecified: Secondary | ICD-10-CM | POA: Insufficient documentation

## 2013-05-26 DIAGNOSIS — E139 Other specified diabetes mellitus without complications: Secondary | ICD-10-CM | POA: Insufficient documentation

## 2013-05-26 DIAGNOSIS — K029 Dental caries, unspecified: Secondary | ICD-10-CM | POA: Insufficient documentation

## 2013-05-26 DIAGNOSIS — K0381 Cracked tooth: Secondary | ICD-10-CM | POA: Insufficient documentation

## 2013-05-26 DIAGNOSIS — E891 Postprocedural hypoinsulinemia: Secondary | ICD-10-CM | POA: Insufficient documentation

## 2013-05-26 DIAGNOSIS — Z79899 Other long term (current) drug therapy: Secondary | ICD-10-CM | POA: Insufficient documentation

## 2013-05-26 DIAGNOSIS — I10 Essential (primary) hypertension: Secondary | ICD-10-CM | POA: Insufficient documentation

## 2013-05-26 DIAGNOSIS — Z90410 Acquired total absence of pancreas: Secondary | ICD-10-CM | POA: Insufficient documentation

## 2013-05-26 MED ORDER — HYDROCODONE-ACETAMINOPHEN 5-325 MG PO TABS
1.0000 | ORAL_TABLET | Freq: Once | ORAL | Status: AC
Start: 2013-05-26 — End: 2013-05-26
  Administered 2013-05-26: 1 via ORAL
  Filled 2013-05-26: qty 1

## 2013-05-26 MED ORDER — PENICILLIN V POTASSIUM 500 MG PO TABS: 500.0000 mg | ORAL_TABLET | Freq: Four times a day (QID) | ORAL | Status: DC

## 2013-05-26 MED ORDER — NAPROXEN 500 MG PO TABS: 500.0000 mg | ORAL_TABLET | Freq: Two times a day (BID) | ORAL | Status: DC

## 2013-05-26 MED ORDER — HYDROCODONE-ACETAMINOPHEN 5-325 MG PO TABS: 1.0000 | ORAL_TABLET | Freq: Four times a day (QID) | ORAL | Status: DC | PRN

## 2013-05-26 MED ORDER — PENICILLIN V POTASSIUM 250 MG PO TABS
500.0000 mg | ORAL_TABLET | Freq: Once | ORAL | Status: AC
  Administered 2013-05-26: 500 mg via ORAL
  Filled 2013-05-26: qty 2

## 2013-05-26 NOTE — Discharge Instructions (Signed)
Dental Caries Dental caries is tooth decay. This decay can cause a hole in teeth (cavity) that can get bigger and deeper over time. HOME CARE  Brush and floss your teeth. Do this at least two times a day.  Use a fluoride toothpaste.  Use a mouth rinse if told by your dentist or doctor.  Eat less sugary and starchy foods. Drink less sugary drinks.  Avoid snacking often on sugary and starchy foods. Avoid sipping often on sugary drinks.  Keep regular checkups and cleanings with your dentist.  Use fluoride supplements if told by your dentist or doctor.  Allow fluoride to be applied to teeth if told by your dentist or doctor. MAKE SURE YOU:  Understand these instructions.  Will watch your condition.  Will get help right away if you are not doing well or get worse. Document Released: 11/19/2007 Document Revised: 10/12/2012 Document Reviewed: 02/12/2012 [facility] Patient Information 2014 [place], [place].  Dental Care and Dentist Visits Dental care supports good overall health. Regular dental visits can also help you avoid dental pain, bleeding, infection, and other more serious health problems in the future. It is important to keep the mouth healthy because diseases in the teeth, gums, and other oral tissues can spread to other areas of the body. Some problems, such as diabetes, heart disease, and pre-term labor have been associated with poor oral health.  See your dentist every 6 months. If you experience emergency problems such as a toothache or broken tooth, go to the dentist right away. If you see your dentist regularly, you may catch problems early. It is easier to be treated for problems in the early stages.  WHAT TO EXPECT AT A DENTIST VISIT  Your dentist will look for many common oral health problems and recommend proper treatment. At your regular dental visit, you can expect:  Gentle cleaning of the teeth and gums. This includes scraping and polishing. This helps to remove the  sticky substance around the teeth and gums (plaque). Plaque forms in the mouth shortly after eating. Over time, plaque hardens on the teeth as tartar. If tartar is not removed regularly, it can cause problems. Cleaning also helps remove stains.  Periodic X-rays. These pictures of the teeth and supporting bone will help your dentist assess the health of your teeth.  Periodic fluoride treatments. Fluoride is a natural mineral shown to help strengthen teeth. Fluoride treatmentinvolves applying a fluoride gel or varnish to the teeth. It is most commonly done in children.  Examination of the mouth, tongue, jaws, teeth, and gums to look for any oral health problems, such as:  Cavities (dental caries). This is decay on the tooth caused by plaque, sugar, and acid in the mouth. It is best to catch a cavity when it is small.  Inflammation of the gums caused by plaque buildup (gingivitis).  Problems with the mouth or malformed or misaligned teeth.  Oral cancer or other diseases of the soft tissues or jaws. KEEP YOUR TEETH AND GUMS HEALTHY For healthy teeth and gums, follow these general guidelines as well as your dentist's specific advice:  Have your teeth professionally cleaned at the dentist every 6 months.  Brush twice daily with a fluoride toothpaste.  Floss your teeth daily.  Ask your dentist if you need fluoride supplements, treatments, or fluoride toothpaste.  Eat a healthy diet. Reduce foods and drinks with added sugar.  Avoid smoking. TREATMENT FOR ORAL HEALTH PROBLEMS If you have oral health problems, treatment varies depending on the conditions  present in your teeth and gums.  Your caregiver will most likely recommend good oral hygiene at each visit.  For cavities, gingivitis, or other oral health disease, your caregiver will perform a procedure to treat the problem. This is typically done at a separate appointment. Sometimes your caregiver will refer you to another dental  specialist for specific tooth problems or for surgery. SEEK IMMEDIATE DENTAL CARE IF:  You have pain, bleeding, or soreness in the gum, tooth, jaw, or mouth area.  A permanent tooth becomes loose or separated from the gum socket.  You experience a blow or injury to the mouth or jaw area. Document Released: 10/22/2010 Document Revised: 05/04/2011 Document Reviewed: 10/22/2010 [facility] Patient Information 2014 [place], [place]. Take medications as directed. Take the antibiotic until completed. Take the Naprosyn every day. Supplement with hydrocodone as needed for additional pain. All of with a dentist information provided above.

## 2013-05-26 NOTE — ED Notes (Signed)
Pt states she cracked left molar Dec 2014-c/o pain and swelling to left side of face x 2 days

## 2013-05-26 NOTE — ED Provider Notes (Signed)
CSN: [id number]     Arrival date & time 05/26/13  1557 History  This chart was scribed for Bailey Kung, MD by Maree Erie, ED Scribe. The patient was seen in room [facility]2/[facility]2. Patient's care was started at 7:42 PM.      Chief Complaint  Patient presents with  . Dental Pain      Patient is a 38 y.o. female presenting with tooth pain. The history is provided by the patient. No language interpreter was used.  Dental Pain Location:  Upper Onset quality:  Gradual Duration:  2 days Timing:  Constant Progression:  Worsening Chronicity:  Recurrent Context: dental fracture   Relieved by: antibiotic. Associated symptoms: facial swelling   Associated symptoms: no difficulty swallowing, no fever and no headaches     HPI Comments: Bailey Bonilla is a 38 y.o. female who presents to the Emergency Department complaining of constant, top left molar pain that began two days ago but worsened last night. She reports swelling to the gums surrounding the tooth. She reports similar pain to the affected tooth after she cracked it in January that subsided temporarily after taking antibiotics. She denies fever, difficulty swallowing, chest pain, shortness of breath, nausea, vomiting, diarrhea or abdominal pain. She denies allergies to any medications.      Past Medical History  Diagnosis Date  . Hypertension   . Diabetes mellitus without complication   . H/O splenectomy   . 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 Knee surgery      right knee, 1995, acl  . Splenectomy, partial      2012  . Hernia repair      2013   Family History  Problem Relation Age of Onset  . Diabetes Father   . Hypertension Father   . Diabetes Paternal Aunt   . Cancer Paternal Aunt   . Hypertension Mother   . Hypertension Sister   . Hyperlipidemia Sister    History  Substance Use Topics  . Smoking status: Never Smoker   . Smokeless tobacco: Not  on file  . Alcohol Use: No   OB History   Grav Para Term Preterm Abortions TAB SAB Ect Mult Living   0 0 0 0 0 0 0 0 0 0      Review of Systems  Constitutional: Negative for fever and chills.  HENT: Positive for dental problem and facial swelling. Negative for rhinorrhea.   Eyes: Negative for visual disturbance.  Respiratory: Negative for cough and shortness of breath.   Cardiovascular: Negative for chest pain and leg swelling.  Gastrointestinal: Negative for nausea, vomiting, abdominal pain and diarrhea.  Genitourinary: Negative for dysuria.  Musculoskeletal: Negative for back pain.  Skin: Negative for rash.  Neurological: Negative for headaches.  Hematological: Does not bruise/bleed easily.  Psychiatric/Behavioral: Negative for confusion.      Allergies  Review of patient's allergies indicates no known allergies.  Home Medications   Current Outpatient Rx  Name  Route  Sig  Dispense  Refill  . HYDROcodone-acetaminophen (NORCO/VICODIN) 5-325 MG per tablet   Oral   Take 1 tablet by mouth every 6 (six) hours as needed for moderate pain.         . fluconazole (DIFLUCAN) 150 MG tablet   Oral   Take 1 tablet (150 mg total) by mouth once.   1 tablet   1   . folic acid (FOLVITE) 762 MCG tablet   Oral  Take 400 mcg by mouth daily.         Marland Kitchen glucose blood test strip      Use as instructed   250 each   12   . hydrochlorothiazide (HYDRODIURIL) 25 MG tablet   Oral   Take 1 tablet (25 mg total) by mouth daily.   60 tablet   3   . HYDROcodone-acetaminophen (NORCO/VICODIN) 5-325 MG per tablet   Oral   Take 1-2 tablets by mouth every 6 (six) hours as needed for moderate pain.   14 tablet   0   . metFORMIN (GLUCOPHAGE) 1000 MG tablet   Oral   Take 1 tablet (1,000 mg total) by mouth 2 (two) times daily.   120 tablet   3   . methyldopa (ALDOMET) 250 MG tablet   Oral   Take 1 tablet (250 mg total) by mouth 2 (two) times daily.   60 tablet   3   . naproxen  (NAPROSYN) 500 MG tablet   Oral   Take 1 tablet (500 mg total) by mouth 2 (two) times daily.   14 tablet   0   . penicillin v potassium (VEETID) 500 MG tablet   Oral   Take 1 tablet (500 mg total) by mouth 4 (four) times daily.   40 tablet   0   . potassium chloride (MICRO-K) 10 MEQ CR capsule   Oral   Take 10 mEq by mouth daily.         . potassium chloride SA (K-DUR,KLOR-CON) 20 MEQ tablet   Oral   Take 1 tablet (20 mEq total) by mouth 2 (two) times daily.   60 tablet   2   . traMADol (ULTRAM) 50 MG tablet   Oral   Take 1 tablet (50 mg total) by mouth every 8 (eight) hours as needed.   40 tablet   0   . vitamin C (ASCORBIC ACID) 500 MG tablet   Oral   Take 500 mg by mouth daily.          Triage Vitals: BP 135/88  Pulse 84  Temp(Src) 98.7 F (37.1 C) (Oral)  Resp 16  Ht 5\' 10"  (1.778 m)  Wt 280 lb (127.007 kg)  BMI 40.18 kg/m2  SpO2 99%  LMP 05/19/2013]  Physical Exam  Nursing note and vitals reviewed. Constitutional: She is oriented to person, place, and time.  HENT:  No swelling on the outside of face. Avulsion of left upper molar.  Eyes: No scleral icterus.  Cardiovascular: Normal rate and regular rhythm.   Pulmonary/Chest: Effort normal and breath sounds normal. No respiratory distress. She has no wheezes. She has no rales.  Abdominal: Bowel sounds are normal. There is no tenderness.  Lymphadenopathy:    She has no cervical adenopathy.  Neurological: She is alert and oriented to person, place, and time.  Skin: Skin is warm and dry.    ED Course  Procedures (including critical care time)  DIAGNOSTIC STUDIES: Oxygen Saturation is 99% on room air, normal by my interpretation.    COORDINATION OF CARE: 7:57 PM -Will discharge with antibiotic. Recommend follow up with dentist. Patient verbalizes understanding and agrees with treatment plan.    Labs Review Labs Reviewed - No data to display Imaging Review No results found.   EKG  Interpretation None      MDM   Final diagnoses:  Pain due to dental caries    Patient the with a cracked left molar with tenderness probably has  an apical abscess. Will start on penicillin anti-inflammatories and pain medicine and followup with dentist. No significant mouth swelling no significant facial swelling.  I personally performed the services described in this documentation, which was scribed in my presence. The recorded information has been reviewed and is accurate.     Bailey Kung, MD 05/26/13 2007

## 2013-05-26 NOTE — ED Notes (Signed)
Pt also reports left sided facial swelling.

## 2013-05-27 NOTE — Progress Notes (Signed)
ED CM received incoming call from patient concerning patient not being able to afford Penicillin. Discussed HT free antibiotic Pen VK on list. Contacted the pharmacy that confirmed Pen VK is free. Pt states, she can fill at [facility]. No further CM needs identified

## 2013-06-13 ENCOUNTER — Ambulatory Visit: Payer: Self-pay | Admitting: Family Medicine

## 2013-06-14 ENCOUNTER — Ambulatory Visit: Payer: Self-pay

## 2013-06-16 ENCOUNTER — Emergency Department (INDEPENDENT_AMBULATORY_CARE_PROVIDER_SITE_OTHER)
Admission: EM | Admit: 2013-06-16 | Discharge: 2013-06-16 | Disposition: A | Discharge status: Home / Self Care | Payer: Managed Care, Other (non HMO) | Source: Home / Self Care | Attending: Family Medicine | Admitting: Family Medicine

## 2013-06-16 ENCOUNTER — Encounter (HOSPITAL_COMMUNITY): Payer: Self-pay | Admitting: Emergency Medicine

## 2013-06-16 DIAGNOSIS — K120 Recurrent oral aphthae: Secondary | ICD-10-CM

## 2013-06-16 NOTE — ED Provider Notes (Signed)
Bailey Bonilla is a 38 y.o. female who presents to Urgent Care today for canker sore. Patient has developed a sore on the mucosa of her right upper lip present for one week. It is painful. She denies any injury fevers chills nausea vomiting or diarrhea. She's tried saltwater gargles which helps some.   Past Medical History  Diagnosis Date  . Hypertension   . Diabetes mellitus without complication   . H/O splenectomy   . 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 Smoking status: Never Smoker   . Smokeless tobacco: Not on file  . Alcohol Use: No   ROS as above Medications: No current facility-administered medications for this encounter.   Current Outpatient Prescriptions  Medication Sig Dispense Refill  . folic acid (FOLVITE) 532 MCG tablet Take 400 mcg by mouth daily.      Marland Kitchen glucose blood test strip Use as instructed  250 each  12  . hydrochlorothiazide (HYDRODIURIL) 25 MG tablet Take 1 tablet (25 mg total) by mouth daily.  60 tablet  3  . HYDROcodone-acetaminophen (NORCO/VICODIN) 5-325 MG per tablet Take 1 tablet by mouth every 6 (six) hours as needed for moderate pain.      . metFORMIN (GLUCOPHAGE) 1000 MG tablet Take 1 tablet (1,000 mg total) by mouth 2 (two) times daily.  120 tablet  3  . methyldopa (ALDOMET) 250 MG tablet Take 1 tablet (250 mg total) by mouth 2 (two) times daily.  60 tablet  3  . naproxen (NAPROSYN) 500 MG tablet Take 1 tablet (500 mg total) by mouth 2 (two) times daily.  14 tablet  0  . potassium chloride (MICRO-K) 10 MEQ CR capsule Take 10 mEq by mouth daily.      . traMADol (ULTRAM) 50 MG tablet Take 1 tablet (50 mg total) by mouth every 8 (eight) hours as needed.  40 tablet  0  . vitamin C (ASCORBIC ACID) 500 MG tablet Take 500 mg by mouth daily.      . fluconazole (DIFLUCAN) 150 MG tablet Take 1 tablet (150 mg total) by mouth once.  1 tablet  1  . HYDROcodone-acetaminophen (NORCO/VICODIN) 5-325 MG per tablet Take 1-2  tablets by mouth every 6 (six) hours as needed for moderate pain.  14 tablet  0  . penicillin v potassium (VEETID) 500 MG tablet Take 1 tablet (500 mg total) by mouth 4 (four) times daily.  40 tablet  0  . potassium chloride SA (K-DUR,KLOR-CON) 20 MEQ tablet Take 1 tablet (20 mEq total) by mouth 2 (two) times daily.  60 tablet  2    Exam:  BP 132/86  Pulse 64  Temp(Src) 98.5 F (36.9 C) (Oral)  Resp 16  Ht 5\' 10"  (1.778 m)  Wt 288 lb (130.636 kg)  BMI 41.32 kg/m2  SpO2 100%  LMP 05/19/2013 Gen: Well NAD HEENT: EOMI,  MMM, 1 cm grayish ulceration with erythematous border. Consistent with aphatous ulcer Lungs: Normal work of breathing. CTABL Heart: RRR no MRG Abd: NABS, Soft. NT, ND Exts: Brisk capillary refill, warm and well perfused.   No results found for this or any previous visit (from the past 24 hour(s)). No results found.  Assessment and Plan: 38 y.o. female with aphthous ulcer. Plan to treat with triamcinolone in [place] and topical Orajel. Triamcinolone called in to [facility] on [address].   Discussed warning signs or symptoms. Please see discharge instructions. Patient expresses understanding.    Gregor Hams,  MD 06/16/13 1113

## 2013-06-16 NOTE — Discharge Instructions (Signed)
Thank you for coming in today. I called in some special ointment to the [facility] on [address].  Please use the triancinolone 4x dialy until the ulcer heals.  Use oragell paste or gell over the counter as needed.  Return as needed.  Call or go to the emergency room if you get worse, have trouble breathing, have chest pains, or palpitations.    Canker Sores  Canker sores are painful, open sores on the inside of the mouth and cheek. They may be white or yellow. The sores usually heal in 1 to 2 weeks. Women are more likely than men to have recurrent canker sores. CAUSES The cause of canker sores is not well understood. More than one cause is likely. Canker sores do not appear to be caused by certain types of germs (viruses or bacteria). Canker sores may be caused by:  An allergic reaction to certain foods.  Digestive problems.  Not having enough vitamin G83, folic acid, and iron.  Female sex hormones. Sores may come only during certain phases of a menstrual cycle. Often, there is improvement during pregnancy.  Genetics. Some people seem to inherit canker sore problems. Emotional stress and injuries to the mouth may trigger outbreaks, but not cause them.  DIAGNOSIS Canker sores are diagnosed by exam.  TREATMENT  Patients who have frequent bouts of canker sores may have cultures taken of the sores, blood tests, or allergy tests. This helps determine if their sores are caused by a poor diet, an allergy, or some other preventable or treatable disease.  Vitamins may prevent recurrences or reduce the severity of canker sores in people with poor nutrition.  Numbing ointments can relieve pain. These are available in drug stores without a prescription.  Anti-inflammatory steroid mouth rinses or gels may be prescribed by your caregiver for severe sores.  Oral steroids may be prescribed if you have severe, recurrent canker sores. These strong medicines can cause many side effects and should be used  only under the close direction of a dentist or physician.  Mouth rinses containing the antibiotic medicine may be prescribed. They may lessen symptoms and speed healing. Healing usually happens in about 1 or 2 weeks with or without treatment. Certain antibiotic mouth rinses given to pregnant women and young children can permanently stain teeth. Talk to your caregiver about your treatment. HOME CARE INSTRUCTIONS   Avoid foods that cause canker sores for you.  Avoid citrus juices, spicy or salty foods, and coffee until the sores are healed.  Use a soft-bristled toothbrush.  Chew your food carefully to avoid biting your cheek.  Apply topical numbing medicine to the sore to help relieve pain.  Apply a thin paste of baking soda and water to the sore to help heal the sore.  Only use mouth rinses or medicines for pain or discomfort as directed by your caregiver. SEEK MEDICAL CARE IF:   Your symptoms are not better in 1 week.  Your sores are still present after 2 weeks.  Your sores are very painful.  You have trouble breathing or swallowing.  Your sores come back frequently. Document Released: 06/06/2010 Document Revised: 06/06/2012 Document Reviewed: 06/06/2010 [facility] Patient Information 2014 [place], [place].

## 2013-06-16 NOTE — ED Notes (Signed)
38 yr old female is here today with complaints of upper lip pain-right side for a week. She states she does not remember biting or lip anything. Denies: SOB, chest pain

## 2013-07-07 ENCOUNTER — Encounter: Payer: Self-pay | Admitting: Internal Medicine

## 2013-07-07 ENCOUNTER — Ambulatory Visit: Payer: Managed Care, Other (non HMO) | Attending: Internal Medicine | Admitting: Internal Medicine

## 2013-07-07 VITALS — BP 127/84 | HR 81 | Temp 98.0°F | Resp 16

## 2013-07-07 DIAGNOSIS — D649 Anemia, unspecified: Secondary | ICD-10-CM | POA: Insufficient documentation

## 2013-07-07 DIAGNOSIS — Z79899 Other long term (current) drug therapy: Secondary | ICD-10-CM | POA: Insufficient documentation

## 2013-07-07 DIAGNOSIS — E119 Type 2 diabetes mellitus without complications: Secondary | ICD-10-CM | POA: Insufficient documentation

## 2013-07-07 DIAGNOSIS — R062 Wheezing: Secondary | ICD-10-CM

## 2013-07-07 DIAGNOSIS — I10 Essential (primary) hypertension: Secondary | ICD-10-CM | POA: Insufficient documentation

## 2013-07-07 DIAGNOSIS — N83209 Unspecified ovarian cyst, unspecified side: Secondary | ICD-10-CM | POA: Insufficient documentation

## 2013-07-07 DIAGNOSIS — R0981 Nasal congestion: Secondary | ICD-10-CM | POA: Insufficient documentation

## 2013-07-07 DIAGNOSIS — J3489 Other specified disorders of nose and nasal sinuses: Secondary | ICD-10-CM

## 2013-07-07 LAB — COMPLETE METABOLIC PANEL WITH GFR
ALT: <8 U/L (ref 0–35)
AST: 12 U/L (ref 0–37)
Albumin: 4 g/dL (ref 3.5–5.2)
Alkaline Phosphatase: 65 U/L (ref 39–117)
BUN: 11 mg/dL (ref 6–23)
CHLORIDE: 103 meq/L (ref 96–112)
CO2: 26 meq/L (ref 19–32)
CREATININE: 0.9 mg/dL (ref 0.50–1.10)
Calcium: 9.4 mg/dL (ref 8.4–10.5)
GFR, Est Non African American: 83 mL/min
Glucose, Bld: 123 mg/dL — ABNORMAL HIGH (ref 70–99)
Potassium: 3.9 mEq/L (ref 3.5–5.3)
Sodium: 139 mEq/L (ref 135–145)
Total Bilirubin: 0.6 mg/dL (ref 0.2–1.2)
Total Protein: 6.6 g/dL (ref 6.0–8.3)

## 2013-07-07 LAB — GLUCOSE, POCT (MANUAL RESULT ENTRY): POC GLUCOSE: 139 mg/dL — AB (ref 70–99)

## 2013-07-07 LAB — POCT GLYCOSYLATED HEMOGLOBIN (HGB A1C): Hemoglobin A1C: 6.8

## 2013-07-07 MED ORDER — FLUTICASONE PROPIONATE 50 MCG/ACT NA SUSP: 2.0000 | Freq: Every day | NASAL | Status: AC

## 2013-07-07 MED ORDER — ALBUTEROL SULFATE HFA 108 (90 BASE) MCG/ACT IN AERS: 2.0000 | INHALATION_SPRAY | Freq: Four times a day (QID) | RESPIRATORY_TRACT | Status: AC | PRN

## 2013-07-07 MED ORDER — FLUCONAZOLE 150 MG PO TABS: 150.0000 mg | ORAL_TABLET | Freq: Once | ORAL | Status: DC

## 2013-07-07 NOTE — Progress Notes (Signed)
MRN: [id number] Name: Bailey Bonilla  Sex: female Age: 38 y.o. DOB: Feb 16, 1976  Allergies: Review of patient's allergies indicates no known allergies.  Chief Complaint  Patient presents with  . Follow-up    HPI: Patient is 38 y.o. female who Has to of diabetes hypertension comes today for followup , patient reported to have lot of environmental allergies and nasal congestion sometimes she gets short of breath as per patient she used albuterol in the past which helped her with the symptoms, denies any fever chills or chest pain, for diabetes she has been taking metformin 1 g twice a day she denies any hypoglycemic symptoms her hemoglobin A1c is trending down also history of hypertension and is on methyldopa and hydrochlorothiazide also take potassium supplement, she also had a blood work done which was reviewed her anemia most likely secondary to menorrhagia , patient also had an ultrasound done reported to have ovarian cysts patient is concerned, she is to follow with her gynecologist in the past. She also reported some itching and questionable vaginal discharge, she was prescribed Diflucan in the past which her with the symptoms.  Past Medical History  Diagnosis Date  . Hypertension   . Diabetes mellitus without complication   . H/O splenectomy   . 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 Knee surgery      right knee, 1995, acl  . Splenectomy, partial      2012  . Hernia repair      2013      Medication List       This list is accurate as of: 07/07/13  3:40 PM.  Always use your most recent med list.               albuterol 108 (90 BASE) MCG/ACT inhaler  Commonly known as:  PROVENTIL HFA;VENTOLIN HFA  Inhale 2 puffs into the lungs every 6 (six) hours as needed for wheezing or shortness of breath.     fluconazole 150 MG tablet  Commonly known as:  DIFLUCAN  Take 1 tablet (150 mg total) by mouth once.     fluticasone 50 MCG/ACT nasal spray  Commonly known as:  FLONASE  Place 2 sprays into both nostrils daily.     folic acid 884 MCG tablet  Commonly known as:  FOLVITE  Take 400 mcg by mouth daily.     glucose blood test strip  Use as instructed     hydrochlorothiazide 25 MG tablet  Commonly known as:  HYDRODIURIL  Take 1 tablet (25 mg total) by mouth daily.     HYDROcodone-acetaminophen 5-325 MG per tablet  Commonly known as:  NORCO/VICODIN  Take 1 tablet by mouth every 6 (six) hours as needed for moderate pain.     HYDROcodone-acetaminophen 5-325 MG per tablet  Commonly known as:  NORCO/VICODIN  Take 1-2 tablets by mouth every 6 (six) hours as needed for moderate pain.     metFORMIN 1000 MG tablet  Commonly known as:  GLUCOPHAGE  Take 1 tablet (1,000 mg total) by mouth 2 (two) times daily.     methyldopa 250 MG tablet  Commonly known as:  ALDOMET  Take 1 tablet (250 mg total) by mouth 2 (two) times daily.     naproxen 500 MG tablet  Commonly known as:  NAPROSYN  Take 1 tablet (500 mg total) by mouth 2 (two) times daily.     penicillin v potassium  500 MG tablet  Commonly known as:  VEETID  Take 1 tablet (500 mg total) by mouth 4 (four) times daily.     potassium chloride 10 MEQ CR capsule  Commonly known as:  MICRO-K  Take 10 mEq by mouth daily.     potassium chloride SA 20 MEQ tablet  Commonly known as:  K-DUR,KLOR-CON  Take 1 tablet (20 mEq total) by mouth 2 (two) times daily.     traMADol 50 MG tablet  Commonly known as:  ULTRAM  Take 1 tablet (50 mg total) by mouth every 8 (eight) hours as needed.     vitamin C 500 MG tablet  Commonly known as:  ASCORBIC ACID  Take 500 mg by mouth daily.        Meds ordered this encounter  Medications  . albuterol (PROVENTIL HFA;VENTOLIN HFA) 108 (90 BASE) MCG/ACT inhaler    Sig: Inhale 2 puffs into the lungs every 6 (six) hours as needed for wheezing or shortness of breath.    Dispense:  1 Inhaler    Refill:  0  .  fluconazole (DIFLUCAN) 150 MG tablet    Sig: Take 1 tablet (150 mg total) by mouth once.    Dispense:  1 tablet    Refill:  1  . fluticasone (FLONASE) 50 MCG/ACT nasal spray    Sig: Place 2 sprays into both nostrils daily.    Dispense:  16 g    Refill:  6    Immunization History  Administered Date(s) Administered  . Influenza Split 11/09/2012    Family History  Problem Relation Age of Onset  . Diabetes Father   . Hypertension Father   . Diabetes Paternal Aunt   . Cancer Paternal Aunt   . Hypertension Mother   . Hypertension Sister   . Hyperlipidemia Sister     History  Substance Use Topics  . Smoking status: Never Smoker   . Smokeless tobacco: Not on file  . Alcohol Use: No    Review of Systems   As noted in HPI  Filed Vitals:   07/07/13 1512  BP: 127/84  Pulse: 81  Temp: 98 F (36.7 C)  Resp: 16    Physical Exam  Physical Exam  HENT:  Nasal congestion no sinus tenderness minimal pharyngeal erythema   Neck: Neck supple.  Cardiovascular: Normal rate and regular rhythm.   Pulmonary/Chest: Breath sounds normal. No respiratory distress. She has no wheezes. She has no rales.    CBC    Component Value Date/Time   WBC 8.7 03/24/2013 1525   RBC 3.76* 03/24/2013 1525   HGB 9.6* 03/24/2013 1525   HCT 30.3* 03/24/2013 1525   PLT 552* 03/24/2013 1525   MCV 80.6 03/24/2013 1525   LYMPHSABS 3.0 03/24/2013 1525   MONOABS 0.7 03/24/2013 1525   EOSABS 0.1 03/24/2013 1525   BASOSABS 0.0 03/24/2013 1525    CMP     Component Value Date/Time   NA 139 03/24/2013 1525   K 3.8 03/24/2013 1525   CL 102 03/24/2013 1525   CO2 28 03/24/2013 1525   GLUCOSE 136* 03/24/2013 1525   BUN 9 03/24/2013 1525   CREATININE 0.77 03/24/2013 1525   CALCIUM 8.9 03/24/2013 1525   PROT 6.8 03/24/2013 1525   ALBUMIN 4.0 03/24/2013 1525   AST 11 03/24/2013 1525   ALT 13 03/24/2013 1525   ALKPHOS 68 03/24/2013 1525   BILITOT 0.7 03/24/2013 1525   GFRNONAA >89 03/24/2013 1525   GFRAA >89 03/24/2013  1525  Lab Results  Component Value Date/Time   CHOL 131 03/24/2013  3:25 PM    No components found with this basename: hga1c    Lab Results  Component Value Date/Time   AST 11 03/24/2013  3:25 PM    Assessment and Plan  DM (diabetes mellitus) - Plan: . Results for orders placed in visit on 07/07/13  GLUCOSE, POCT (MANUAL RESULT ENTRY)      Result Value Ref Range   POC Glucose 139 (*) 70 - 99 mg/dl  POCT GLYCOSYLATED HEMOGLOBIN (HGB A1C)      Result Value Ref Range   Hemoglobin A1C 6.8     Diabetes is improving continue with metformin 1 g twice a day. And diabetes meal planning.  Essential hypertension, benign - Plan: Controlled her continue with her current medications, will repeat blood chemistry COMPLETE METABOLIC PANEL WITH GFR  Anemia - Plan: Previous CBC showed anemia, will order Anemia panel  Other and unspecified ovarian cyst - Plan: Referred back to  Gynecology  Wheezing - Plan: albuterol (PROVENTIL HFA;VENTOLIN HFA) 108 (90 BASE) MCG/ACT inhaler when necessary for wheezing or shortness of breath  Nasal congestion - Plan: fluticasone (FLONASE) 50 MCG/ACT nasal spray Advised patient for saltwater gargles.   Return in about 3 months (around 10/07/2013) for diabetes, hypertension.  Lorayne Marek, MD

## 2013-07-07 NOTE — Progress Notes (Signed)
Patient here for follow up on her diabetes And to check her A1C

## 2013-07-08 LAB — ANEMIA PANEL
%SAT: 6 % — ABNORMAL LOW (ref 20–55)
ABS Retic: 51 10*3/uL (ref 19.0–186.0)
FERRITIN: 7 ng/mL — AB (ref 10–291)
Folate: >20 ng/mL
Iron: 25 ug/dL — ABNORMAL LOW (ref 42–145)
RBC.: 3.64 MIL/uL — AB (ref 3.87–5.11)
RETIC CT PCT: 1.4 % (ref 0.4–2.3)
TIBC: 416 ug/dL (ref 250–470)
UIBC: 391 ug/dL (ref 125–400)
Vitamin B-12: 579 pg/mL (ref 211–911)

## 2013-07-10 ENCOUNTER — Telehealth: Payer: Self-pay

## 2013-07-10 NOTE — Telephone Encounter (Signed)
Message copied by Dorothe Pea on Mon Jul 10, 2013 12:54 PM ------      Message from: Lorayne Marek      Created: Mon Jul 10, 2013  9:29 AM       Blood work consistent with iron deficiency anemia, advise patient to start taking ferrous sulfate 1 tab 3 times a day, will repeat CBC on the following visit. ------

## 2013-07-10 NOTE — Telephone Encounter (Signed)
Patient is aware of her lab results 

## 2013-07-26 ENCOUNTER — Other Ambulatory Visit: Payer: Self-pay | Admitting: Internal Medicine

## 2013-07-26 NOTE — Telephone Encounter (Signed)
Pt calling regarding refill for metformin extended release. Please f/u with pt once script has been sent.

## 2013-07-28 NOTE — Telephone Encounter (Signed)
Patient is aware of her medication refill

## 2013-08-18 ENCOUNTER — Encounter: Payer: Self-pay | Admitting: Obstetrics & Gynecology

## 2013-09-15 ENCOUNTER — Encounter: Payer: Self-pay | Admitting: General Practice

## 2013-09-29 ENCOUNTER — Encounter: Payer: Self-pay | Admitting: Obstetrics & Gynecology

## 2013-10-19 ENCOUNTER — Other Ambulatory Visit: Payer: Self-pay

## 2013-10-19 ENCOUNTER — Other Ambulatory Visit: Payer: Self-pay | Admitting: Internal Medicine

## 2013-10-19 ENCOUNTER — Telehealth: Payer: Self-pay | Admitting: Internal Medicine

## 2013-10-19 MED ORDER — HYDROCHLOROTHIAZIDE 25 MG PO TABS: 25.0000 mg | ORAL_TABLET | Freq: Every day | ORAL | Status: DC

## 2013-10-19 MED ORDER — POTASSIUM CHLORIDE CRYS ER 20 MEQ PO TBCR: 20.0000 meq | EXTENDED_RELEASE_TABLET | Freq: Two times a day (BID) | ORAL | Status: DC

## 2013-10-19 NOTE — Telephone Encounter (Signed)
Pt.is out of town and would like to get a one time refill for potassium chloride SA (K-DUR,KLOR-CON) 20 MEQ and hydrochlorothiazide (HYDRODIURIL) 25 MG tablet. Please f/u with pt.

## 2013-10-19 NOTE — Telephone Encounter (Signed)
Pt.would like for it to be sent to [facility] in [place] [phone]

## 2013-11-14 ENCOUNTER — Encounter: Payer: Self-pay | Admitting: Internal Medicine

## 2013-11-14 ENCOUNTER — Ambulatory Visit: Payer: Managed Care, Other (non HMO) | Attending: Internal Medicine | Admitting: Internal Medicine

## 2013-11-14 VITALS — BP 133/87 | HR 81 | Temp 99.0°F | Resp 16 | Wt 266.0 lb

## 2013-11-14 DIAGNOSIS — E089 Diabetes mellitus 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: Secondary | ICD-10-CM

## 2013-11-14 DIAGNOSIS — Z23 Encounter for immunization: Secondary | ICD-10-CM | POA: Insufficient documentation

## 2013-11-14 DIAGNOSIS — Z90410 Acquired total absence of pancreas: Secondary | ICD-10-CM | POA: Insufficient documentation

## 2013-11-14 DIAGNOSIS — J329 Chronic sinusitis, unspecified: Secondary | ICD-10-CM | POA: Insufficient documentation

## 2013-11-14 DIAGNOSIS — Z9089 Acquired absence of other organs: Secondary | ICD-10-CM | POA: Insufficient documentation

## 2013-11-14 DIAGNOSIS — D509 Iron deficiency anemia, unspecified: Secondary | ICD-10-CM | POA: Insufficient documentation

## 2013-11-14 DIAGNOSIS — E119 Type 2 diabetes mellitus without complications: Secondary | ICD-10-CM | POA: Insufficient documentation

## 2013-11-14 DIAGNOSIS — I10 Essential (primary) hypertension: Secondary | ICD-10-CM | POA: Insufficient documentation

## 2013-11-14 DIAGNOSIS — Z791 Long term (current) use of non-steroidal anti-inflammatories (NSAID): Secondary | ICD-10-CM | POA: Insufficient documentation

## 2013-11-14 DIAGNOSIS — E139 Other specified diabetes mellitus without complications: Secondary | ICD-10-CM

## 2013-11-14 LAB — CBC WITH DIFFERENTIAL/PLATELET
BASOS PCT: 1 % (ref 0–1)
Basophils Absolute: 0.1 10*3/uL (ref 0.0–0.1)
Eosinophils Absolute: 0.1 10*3/uL (ref 0.0–0.7)
Eosinophils Relative: 1 % (ref 0–5)
HCT: 35.2 % — ABNORMAL LOW (ref 36.0–46.0)
Hemoglobin: 11.5 g/dL — ABNORMAL LOW (ref 12.0–15.0)
Lymphocytes Relative: 44 % (ref 12–46)
Lymphs Abs: 3.3 10*3/uL (ref 0.7–4.0)
MCH: 27.1 pg (ref 26.0–34.0)
MCHC: 32.7 g/dL (ref 30.0–36.0)
MCV: 83 fL (ref 78.0–100.0)
MONOS PCT: 6 % (ref 3–12)
Monocytes Absolute: 0.5 10*3/uL (ref 0.1–1.0)
Neutro Abs: 3.6 10*3/uL (ref 1.7–7.7)
Neutrophils Relative %: 48 % (ref 43–77)
Platelets: 532 10*3/uL — ABNORMAL HIGH (ref 150–400)
RBC: 4.24 MIL/uL (ref 3.87–5.11)
RDW: 20.4 % — AB (ref 11.5–15.5)
WBC: 7.6 10*3/uL (ref 4.0–10.5)

## 2013-11-14 LAB — POCT GLYCOSYLATED HEMOGLOBIN (HGB A1C): Hemoglobin A1C: 6.6

## 2013-11-14 LAB — GLUCOSE, POCT (MANUAL RESULT ENTRY): POC Glucose: 95 mg/dl (ref 70–99)

## 2013-11-14 MED ORDER — AZITHROMYCIN 250 MG PO TABS: ORAL_TABLET | ORAL | Status: AC

## 2013-11-14 MED ORDER — METHYLDOPA 250 MG PO TABS: 250.0000 mg | ORAL_TABLET | Freq: Two times a day (BID) | ORAL | Status: DC

## 2013-11-14 MED ORDER — FLUCONAZOLE 150 MG PO TABS: 150.0000 mg | ORAL_TABLET | Freq: Once | ORAL | Status: DC

## 2013-11-14 MED ORDER — POTASSIUM CHLORIDE CRYS ER 20 MEQ PO TBCR: 20.0000 meq | EXTENDED_RELEASE_TABLET | Freq: Two times a day (BID) | ORAL | Status: DC

## 2013-11-14 MED ORDER — HYDROCHLOROTHIAZIDE 25 MG PO TABS: 25.0000 mg | ORAL_TABLET | Freq: Every day | ORAL | Status: DC

## 2013-11-14 MED ORDER — METFORMIN HCL ER 500 MG PO TB24: ORAL_TABLET | ORAL | Status: DC

## 2013-11-14 NOTE — Progress Notes (Signed)
MRN: [id number] Name: Bailey Bonilla  Sex: female Age: 38 y.o. DOB: 1975-07-07  Allergies: Review of patient's allergies indicates no known allergies.  Chief Complaint  Patient presents with  . Follow-up    HPI: Patient is 38 y.o. female who has to of diabetes hypertension anemia comes today for followup, she is compliant in taking her medications, she denies any hypoglycemic symptoms, her hemoglobin A1c is improving currently taking metformin thousand milligram twice a day, for hypertension she is on methyldopa hydrochlorothiazide and also takes doesn't supplement, patient reported to have lot of nasal congestion postnasal drip sinus pressure for the last few days has some low-grade fever denies any chest pain or shortness of breath, she is requesting refill on her medications.  Past Medical History  Diagnosis Date  . Hypertension   . Diabetes mellitus without complication   . H/O splenectomy   . 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 Knee surgery      right knee, 1995, acl  . Splenectomy, partial      2012  . Hernia repair      2013      Medication List       This list is accurate as of: 11/14/13  5:12 PM.  Always use your most recent med list.               albuterol 108 (90 BASE) MCG/ACT inhaler  Commonly known as:  PROVENTIL HFA;VENTOLIN HFA  Inhale 2 puffs into the lungs every 6 (six) hours as needed for wheezing or shortness of breath.     azithromycin 250 MG tablet  Commonly known as:  ZITHROMAX Z-PAK  Take as directed     fluconazole 150 MG tablet  Commonly known as:  DIFLUCAN  Take 1 tablet (150 mg total) by mouth once.     fluconazole 150 MG tablet  Commonly known as:  DIFLUCAN  Take 1 tablet (150 mg total) by mouth once.     fluticasone 50 MCG/ACT nasal spray  Commonly known as:  FLONASE  Place 2 sprays into both nostrils daily.     folic acid 053 MCG tablet  Commonly known  as:  FOLVITE  Take 400 mcg by mouth daily.     glucose blood test strip  Use as instructed     hydrochlorothiazide 25 MG tablet  Commonly known as:  HYDRODIURIL  Take 1 tablet (25 mg total) by mouth daily.     metFORMIN 500 MG 24 hr tablet  Commonly known as:  GLUCOPHAGE-XR  TAKE 2 TABLETS BY MOUTH TWICE DAILY.     methyldopa 250 MG tablet  Commonly known as:  ALDOMET  Take 1 tablet (250 mg total) by mouth 2 (two) times daily.     naproxen 500 MG tablet  Commonly known as:  NAPROSYN  Take 1 tablet (500 mg total) by mouth 2 (two) times daily.     penicillin v potassium 500 MG tablet  Commonly known as:  VEETID  Take 1 tablet (500 mg total) by mouth 4 (four) times daily.     potassium chloride 10 MEQ CR capsule  Commonly known as:  MICRO-K  Take 10 mEq by mouth daily.     potassium chloride SA 20 MEQ tablet  Commonly known as:  K-DUR,KLOR-CON  Take 1 tablet (20 mEq total) by mouth 2 (two) times daily.     traMADol 50 MG tablet  Commonly known  as:  ULTRAM  Take 1 tablet (50 mg total) by mouth every 8 (eight) hours as needed.     vitamin C 500 MG tablet  Commonly known as:  ASCORBIC ACID  Take 500 mg by mouth daily.        Meds ordered this encounter  Medications  . metFORMIN (GLUCOPHAGE-XR) 500 MG 24 hr tablet    Sig: TAKE 2 TABLETS BY MOUTH TWICE DAILY.    Dispense:  120 tablet    Refill:  2  . hydrochlorothiazide (HYDRODIURIL) 25 MG tablet    Sig: Take 1 tablet (25 mg total) by mouth daily.    Dispense:  30 tablet    Refill:  3  . methyldopa (ALDOMET) 250 MG tablet    Sig: Take 1 tablet (250 mg total) by mouth 2 (two) times daily.    Dispense:  60 tablet    Refill:  3  . potassium chloride SA (K-DUR,KLOR-CON) 20 MEQ tablet    Sig: Take 1 tablet (20 mEq total) by mouth 2 (two) times daily.    Dispense:  60 tablet    Refill:  3  . azithromycin (ZITHROMAX Z-PAK) 250 MG tablet    Sig: Take as directed    Dispense:  6 each    Refill:  0  . fluconazole  (DIFLUCAN) 150 MG tablet    Sig: Take 1 tablet (150 mg total) by mouth once.    Dispense:  1 tablet    Refill:  0    Immunization History  Administered Date(s) Administered  . Influenza Split 11/09/2012  . Influenza,inj,Quad PF,36+ Mos 11/14/2013    Family History  Problem Relation Age of Onset  . Diabetes Father   . Hypertension Father   . Diabetes Paternal Aunt   . Cancer Paternal Aunt   . Hypertension Mother   . Hypertension Sister   . Hyperlipidemia Sister     History  Substance Use Topics  . Smoking status: Never Smoker   . Smokeless tobacco: Not on file  . Alcohol Use: No    Review of Systems   As noted in HPI  Filed Vitals:   11/14/13 1635  BP: 133/87  Pulse: 81  Temp: 99 F (37.2 C)  Resp: 16    Physical Exam  Physical Exam  HENT:  Nasal congestion, sinus tenderness   Eyes: EOM are normal. Pupils are equal, round, and reactive to light.  Cardiovascular: Normal rate and regular rhythm.   Pulmonary/Chest: Breath sounds normal. No respiratory distress. She has no wheezes. She has no rales.  Musculoskeletal: She exhibits no edema.  Lymphadenopathy:    She has cervical adenopathy.    CBC    Component Value Date/Time   WBC 8.7 03/24/2013 1525   RBC 3.64* 07/07/2013 1543   RBC 3.76* 03/24/2013 1525   HGB 9.6* 03/24/2013 1525   HCT 30.3* 03/24/2013 1525   PLT 552* 03/24/2013 1525   MCV 80.6 03/24/2013 1525   LYMPHSABS 3.0 03/24/2013 1525   MONOABS 0.7 03/24/2013 1525   EOSABS 0.1 03/24/2013 1525   BASOSABS 0.0 03/24/2013 1525    CMP     Component Value Date/Time   NA 139 07/07/2013 1543   K 3.9 07/07/2013 1543   CL 103 07/07/2013 1543   CO2 26 07/07/2013 1543   GLUCOSE 123* 07/07/2013 1543   BUN 11 07/07/2013 1543   CREATININE 0.90 07/07/2013 1543   CALCIUM 9.4 07/07/2013 1543   PROT 6.6 07/07/2013 1543   ALBUMIN 4.0 07/07/2013 1543  AST 12 07/07/2013 1543   ALT <8 07/07/2013 1543   ALKPHOS 65 07/07/2013 1543   BILITOT 0.6 07/07/2013 1543   GFRNONAA  83 07/07/2013 1543   GFRAA >89 07/07/2013 1543    Lab Results  Component Value Date/Time   CHOL 131 03/24/2013  3:25 PM    No components found with this basename: hga1c    Lab Results  Component Value Date/Time   AST 12 07/07/2013  3:43 PM    Assessment and Plan  Diabetes mellitus 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 - Plan:  Results for orders placed in visit on 11/14/13  GLUCOSE, POCT (MANUAL RESULT ENTRY)      Result Value Ref Range   POC Glucose 95  70 - 99 mg/dl  POCT GLYCOSYLATED HEMOGLOBIN (HGB A1C)      Result Value Ref Range   Hemoglobin A1C 6.6     Her A1c is improving, she'll continue with current dose of metformin, will repeat blood chemistry COMPLETE METABOLIC PANEL WITH GFR, metFORMIN (GLUCOPHAGE-XR) 500 MG 24 hr tablet  Need for prophylactic vaccination and inoculation against influenza Flu shot given today  Essential hypertension, benign - Plan: Will repeat COMPLETE METABOLIC PANEL WITH GFR, hydrochlorothiazide (HYDRODIURIL) 25 MG tablet, methyldopa (ALDOMET) 250 MG tablet  Anemia, iron deficiency - Plan: Patient currently taking iron supplement will repeat CBC with Differential  Unspecified sinusitis (chronic) - Plan: azithromycin (ZITHROMAX Z-PAK) 250 MG tablet   Health Maintenance    -Influenza shot given today   Return in about 3 months (around 02/13/2014) for diabetes, hypertension, anemia.  Lorayne Marek, MD

## 2013-11-14 NOTE — Progress Notes (Signed)
Patient here for follow up on her HTN and diabetes

## 2013-11-15 ENCOUNTER — Ambulatory Visit (INDEPENDENT_AMBULATORY_CARE_PROVIDER_SITE_OTHER): Payer: Managed Care, Other (non HMO) | Admitting: Obstetrics & Gynecology

## 2013-11-15 ENCOUNTER — Encounter: Payer: Self-pay | Admitting: Obstetrics & Gynecology

## 2013-11-15 VITALS — BP 119/73 | HR 74 | Temp 98.7°F | Ht 69.0 in | Wt 265.9 lb

## 2013-11-15 DIAGNOSIS — N83209 Unspecified ovarian cyst, unspecified side: Secondary | ICD-10-CM

## 2013-11-15 LAB — COMPLETE METABOLIC PANEL WITH GFR
ALT: 9 U/L (ref 0–35)
AST: 15 U/L (ref 0–37)
Albumin: 4.3 g/dL (ref 3.5–5.2)
Alkaline Phosphatase: 77 U/L (ref 39–117)
BUN: 7 mg/dL (ref 6–23)
CO2: 29 mEq/L (ref 19–32)
Calcium: 9.6 mg/dL (ref 8.4–10.5)
Chloride: 100 mEq/L (ref 96–112)
Creat: 0.84 mg/dL (ref 0.50–1.10)
GFR, Est African American: >89 mL/min
GFR, Est Non African American: 89 mL/min
GLUCOSE: 81 mg/dL (ref 70–99)
Potassium: 3.9 mEq/L (ref 3.5–5.3)
SODIUM: 139 meq/L (ref 135–145)
Total Bilirubin: 0.6 mg/dL (ref 0.2–1.2)
Total Protein: 7.5 g/dL (ref 6.0–8.3)

## 2013-11-15 NOTE — Progress Notes (Signed)
Subjective:     Patient ID: Bailey Bonilla, female   DOB: 09/11/1975, 38 y.o.   MRN: [id number]  HPI Pt presents on referral from her primary care physician. She is not currently having problems however, she had a cyst on her ovary in 12/2012 that was not followed up on. She is worried because she had issues with cysts on her pancreas.  She reports that the cyst was asymptomatic at that time.     Past Medical History  Diagnosis Date  . Hypertension   . Diabetes mellitus without complication   . H/O splenectomy   . 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 Knee surgery      right knee, 1995, acl  . Splenectomy, partial      2012  . Hernia repair      2013   Current Outpatient Prescriptions on File Prior to Visit  Medication Sig Dispense Refill  . albuterol (PROVENTIL HFA;VENTOLIN HFA) 108 (90 BASE) MCG/ACT inhaler Inhale 2 puffs into the lungs every 6 (six) hours as needed for wheezing or shortness of breath.  1 Inhaler  0  . azithromycin (ZITHROMAX Z-PAK) 250 MG tablet Take as directed  6 each  0  . fluticasone (FLONASE) 50 MCG/ACT nasal spray Place 2 sprays into both nostrils daily.  16 g  6  . folic acid (FOLVITE) 283 MCG tablet Take 400 mcg by mouth daily.      Marland Kitchen glucose blood test strip Use as instructed  250 each  12  . hydrochlorothiazide (HYDRODIURIL) 25 MG tablet Take 1 tablet (25 mg total) by mouth daily.  30 tablet  3  . metFORMIN (GLUCOPHAGE-XR) 500 MG 24 hr tablet TAKE 2 TABLETS BY MOUTH TWICE DAILY.  120 tablet  2  . methyldopa (ALDOMET) 250 MG tablet Take 1 tablet (250 mg total) by mouth 2 (two) times daily.  60 tablet  3  . vitamin C (ASCORBIC ACID) 500 MG tablet Take 500 mg by mouth daily.       No current facility-administered medications on file prior to visit.   No Known Allergies History   Social History  . Marital Status: Single    Spouse Name: N/A    Number of Children: N/A  . Years of Education:  N/A   Occupational History  . Not on file.   Social History Main Topics  . Smoking status: Never Smoker   . Smokeless tobacco: Not on file  . Alcohol Use: No  . Drug Use: No  . Sexual Activity: Not on file   Other Topics Concern  . Not on file   Social History Narrative  . No narrative on file   Review of Systems     Objective:   Physical Exam BP 119/73  Pulse 74  Temp(Src) 98.7 F (37.1 C) (Oral)  Ht 5\' 9"  (1.753 m)  Wt 265 lb 14.4 oz (120.611 kg)  BMI 39.25 kg/m2  LMP 11/08/2013 Exam deferred   01/06/2013 EXAM:  TRANSABDOMINAL AND TRANSVAGINAL ULTRASOUND OF PELVIS  TECHNIQUE:  Both transabdominal and transvaginal ultrasound examinations of the  pelvis were performed. Transabdominal technique was performed for  global imaging of the pelvis including uterus, ovaries, adnexal  regions, and pelvic cul-de-sac. It was necessary to proceed with  endovaginal exam following the transabdominal exam to visualize the  ovaries.  COMPARISON: CT scan of the abdomen and pelvis dated October 17, 2012.  FINDINGS:  Uterus  Measurements: 7.3 x 4.7 x 5.1 cm. There is likely a fibroid in the  right aspect of the uterine fundus measuring 1.4 x 1 x 1.3 cm.  Endometrium  Thickness: 5.8 mm. No focal abnormality visualized.  Right ovary  Measurements: 6.5 x 6.9 x 6.6 cm. A large cystic component is  present occupying much of the ovarian volume. There is some  peripheral vascularity around the cystic component of the right  ovary.  Left ovary  Measurements: 3.2 x 2.4 x 2.1 cm. There are multiple hypoechoic  anechoic structures within the left ovary consistent with cysts  similar to that demonstrated on the previous CT. The left ovary does  not appear hypervascular. No definite hydrosalpinx is demonstrated  Other findings  No free fluid.  IMPRESSION:  1. There is a small fibroid in the right aspect of the uterine  fundus. The endometrial stripe appears normal.  2. There is a  dominant cyst occupying much of the volume of the  right ovary similar to that seen on the previous CT scan. There are  multiple smaller cysts within the left ovary.  3. There is no free fluid in the cul de sac.  4. Is difficult to compare the findings on this ultrasound 1-1 with  those on the previous CT scan. It may be useful to consider the  patient for performing a repeat pelvic CT scan. Correlation of the  current findings with the patient's clinical and laboratory values  is needed.     Assessment:     Ovarian cysts- asymptomatic     Plan:     F/u in 1 year or sooner prn Pelvic sono  Review new PAP guidelines.  Will get next PAP in 2017 unless problems.

## 2013-11-15 NOTE — Patient Instructions (Signed)
Ovarian Cyst An ovarian cyst is a fluid-filled sac that forms on an ovary. The ovaries are small organs that produce eggs in women. Various types of cysts can form on the ovaries. Most are not cancerous. Many do not cause problems, and they often go away on their own. Some may cause symptoms and require treatment. Common types of ovarian cysts include:  Functional cysts--These cysts may occur every month during the menstrual cycle. This is normal. The cysts usually go away with the next menstrual cycle if the woman does not get pregnant. Usually, there are no symptoms with a functional cyst.  Endometrioma cysts--These cysts form from the tissue that lines the uterus. They are also called "chocolate cysts" because they become filled with blood that turns brown. This type of cyst can cause pain in the lower abdomen during intercourse and with your menstrual period.  Cystadenoma cysts--This type develops from the cells on the outside of the ovary. These cysts can get very big and cause lower abdomen pain and pain with intercourse. This type of cyst can twist on itself, cut off its blood supply, and cause severe pain. It can also easily rupture and cause a lot of pain.  Dermoid cysts--This type of cyst is sometimes found in both ovaries. These cysts may contain different kinds of body tissue, such as skin, teeth, hair, or cartilage. They usually do not cause symptoms unless they get very big.  Theca lutein cysts--These cysts occur when too much of a certain hormone (human chorionic gonadotropin) is produced and overstimulates the ovaries to produce an egg. This is most common after procedures used to assist with the conception of a baby (in vitro fertilization). CAUSES   Fertility drugs can cause a condition in which multiple large cysts are formed on the ovaries. This is called ovarian hyperstimulation syndrome.  A condition called polycystic ovary syndrome can cause hormonal imbalances that can lead to  nonfunctional ovarian cysts. SIGNS AND SYMPTOMS  Many ovarian cysts do not cause symptoms. If symptoms are present, they may include:  Pelvic pain or pressure.  Pain in the lower abdomen.  Pain during sexual intercourse.  Increasing girth (swelling) of the abdomen.  Abnormal menstrual periods.  Increasing pain with menstrual periods.  Stopping having menstrual periods without being pregnant. DIAGNOSIS  These cysts are commonly found during a routine or annual pelvic exam. Tests may be ordered to find out more about the cyst. These tests may include:  Ultrasound.  X-ray of the pelvis.  CT scan.  MRI.  Blood tests. TREATMENT  Many ovarian cysts go away on their own without treatment. Your health care provider may want to check your cyst regularly for 2-3 months to see if it changes. For women in menopause, it is particularly important to monitor a cyst closely because of the higher rate of ovarian cancer in menopausal women. When treatment is needed, it may include any of the following:  A procedure to drain the cyst (aspiration). This may be done using a long needle and ultrasound. It can also be done through a laparoscopic procedure. This involves using a thin, lighted tube with a tiny camera on the end (laparoscope) inserted through a small incision.  Surgery to remove the whole cyst. This may be done using laparoscopic surgery or an open surgery involving a larger incision in the lower abdomen.  Hormone treatment or birth control pills. These methods are sometimes used to help dissolve a cyst. HOME CARE INSTRUCTIONS   Only take over-the-counter   or prescription medicines as directed by your health care provider.  Follow up with your health care provider as directed.  Get regular pelvic exams and Pap tests. SEEK MEDICAL CARE IF:   Your periods are late, irregular, or painful, or they stop.  Your pelvic pain or abdominal pain does not go away.  Your abdomen becomes  larger or swollen.  You have pressure on your bladder or trouble emptying your bladder completely.  You have pain during sexual intercourse.  You have feelings of fullness, pressure, or discomfort in your stomach.  You lose weight for no apparent reason.  You feel generally ill.  You become constipated.  You lose your appetite.  You develop acne.  You have an increase in body and facial hair.  You are gaining weight, without changing your exercise and eating habits.  You think you are pregnant. SEEK IMMEDIATE MEDICAL CARE IF:   You have increasing abdominal pain.  You feel sick to your stomach (nauseous), and you throw up (vomit).  You develop a fever that comes on suddenly.  You have abdominal pain during a bowel movement.  Your menstrual periods become heavier than usual. MAKE SURE YOU:  Understand these instructions.  Will watch your condition.  Will get help right away if you are not doing well or get worse. Document Released: 02/09/2005 Document Revised: 02/14/2013 Document Reviewed: 10/17/2012 Exit[employer] Patient Information 2015 [employer]. This information is not intended to replace advice given to you by your health care provider. Make sure you discuss any questions you have with your health care provider.  

## 2013-11-17 ENCOUNTER — Ambulatory Visit (HOSPITAL_COMMUNITY)
Admission: RE | Admit: 2013-11-17 | Discharge: 2013-11-17 | Disposition: A | Discharge status: Home / Self Care | Payer: Managed Care, Other (non HMO) | Source: Ambulatory Visit | Attending: Obstetrics & Gynecology | Admitting: Obstetrics & Gynecology

## 2013-11-17 DIAGNOSIS — N854 Malposition of uterus: Secondary | ICD-10-CM | POA: Insufficient documentation

## 2013-11-17 DIAGNOSIS — D251 Intramural leiomyoma of uterus: Secondary | ICD-10-CM | POA: Insufficient documentation

## 2013-11-17 DIAGNOSIS — N83209 Unspecified ovarian cyst, unspecified side: Secondary | ICD-10-CM | POA: Insufficient documentation

## 2013-11-23 ENCOUNTER — Telehealth: Payer: Self-pay | Admitting: Obstetrics & Gynecology

## 2013-11-23 NOTE — Telephone Encounter (Signed)
T.C. To pt to discuss results of sono.  Pt notified that she has a persistent cyst on her right ovary. Pt confirms that she is asymptomatic.  She was told that since it has been present since 2008 and has remained stable, there is no need for further evaluation.  Pt notified to f/u annually or sooner prn if she develops sx.  All questions were answered  Ryder Man L. Harraway-Smith, M.D., Cherlynn June

## 2014-04-12 ENCOUNTER — Other Ambulatory Visit: Payer: Self-pay | Admitting: Internal Medicine

## 2014-04-19 ENCOUNTER — Other Ambulatory Visit: Payer: Self-pay | Admitting: Internal Medicine

## 2014-04-20 ENCOUNTER — Telehealth: Payer: Self-pay

## 2014-04-20 DIAGNOSIS — E089 Diabetes mellitus 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: Secondary | ICD-10-CM

## 2014-04-20 DIAGNOSIS — I10 Essential (primary) hypertension: Secondary | ICD-10-CM

## 2014-04-20 MED ORDER — HYDROCHLOROTHIAZIDE 25 MG PO TABS: 25.0000 mg | ORAL_TABLET | Freq: Every day | ORAL | Status: AC

## 2014-04-20 MED ORDER — METFORMIN HCL ER 500 MG PO TB24: ORAL_TABLET | ORAL | Status: AC

## 2014-04-20 MED ORDER — POTASSIUM CHLORIDE CRYS ER 20 MEQ PO TBCR: 20.0000 meq | EXTENDED_RELEASE_TABLET | Freq: Every day | ORAL | Status: AC

## 2014-04-20 NOTE — Telephone Encounter (Signed)
Patient called requesting refills on her diabetes and blood pressure medication Prescriptions sent to [facility] in [place]

## 2014-04-23 ENCOUNTER — Other Ambulatory Visit: Payer: Self-pay | Admitting: *Deleted

## 2014-04-23 ENCOUNTER — Other Ambulatory Visit: Payer: Self-pay | Admitting: Internal Medicine

## 2014-04-23 DIAGNOSIS — I10 Essential (primary) hypertension: Secondary | ICD-10-CM

## 2014-04-23 MED ORDER — METHYLDOPA 250 MG PO TABS: 250.0000 mg | ORAL_TABLET | Freq: Two times a day (BID) | ORAL | Status: AC

## 2014-04-23 NOTE — Telephone Encounter (Signed)
Rx refill send to [facility] to schedule F/U appointment

## 2014-04-23 NOTE — Telephone Encounter (Signed)
Patient called to request a med refill for methyldopa (ALDOMET) 250 MG tablet. Please f/u with pt.

## 2016-02-04 IMAGING — US US PELVIS COMPLETE
1 series · 13 of 25 positions shown · non-contrast
Comparison: Ultrasound 01/06/2013, CT 10/17/2012, pelvic ultrasound
12/24/2006

CLINICAL DATA: Followup ovarian cyst

EXAM:
TRANSABDOMINAL AND TRANSVAGINAL ULTRASOUND OF PELVIS
TECHNIQUE: Both transabdominal and transvaginal ultrasound examinations of the
pelvis were performed. Transabdominal technique was performed for
global imaging of the pelvis including uterus, ovaries, adnexal
regions, and pelvic cul-de-sac. It was necessary to proceed with
endovaginal exam following the transabdominal exam to visualize the
endometrium and ovaries.

[Series 1: us pelvis complete · 13 of 47 slices shown]
[im 1/47]
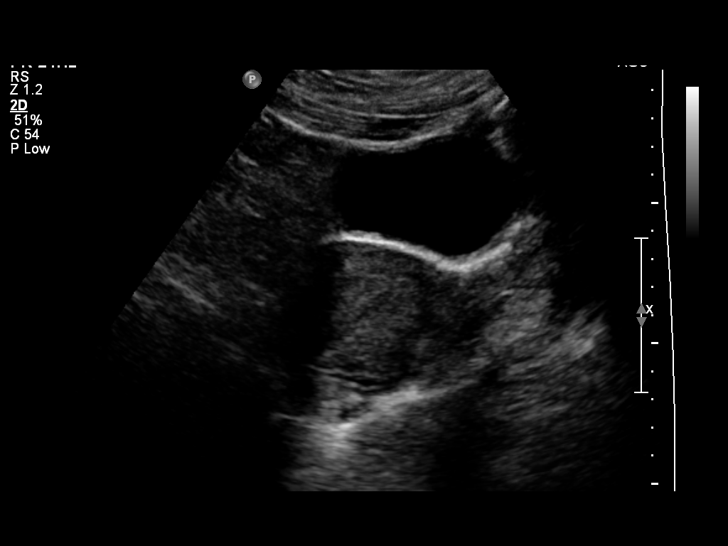
[im 4/47]
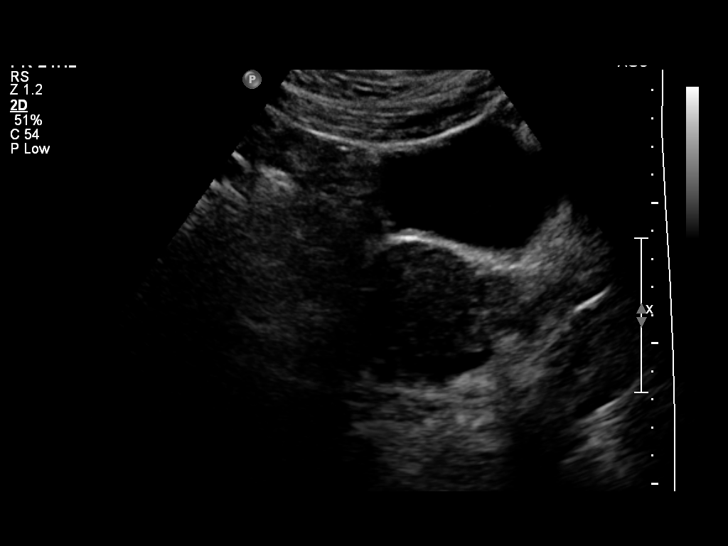
[im 8/47]
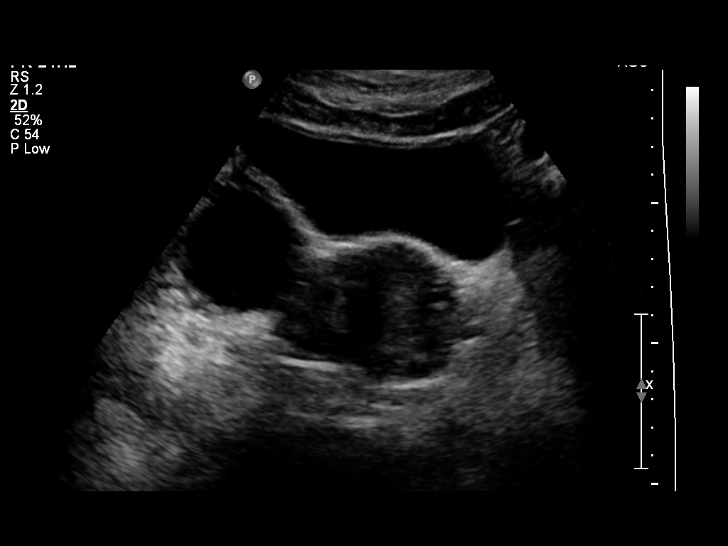
[im 12/47]
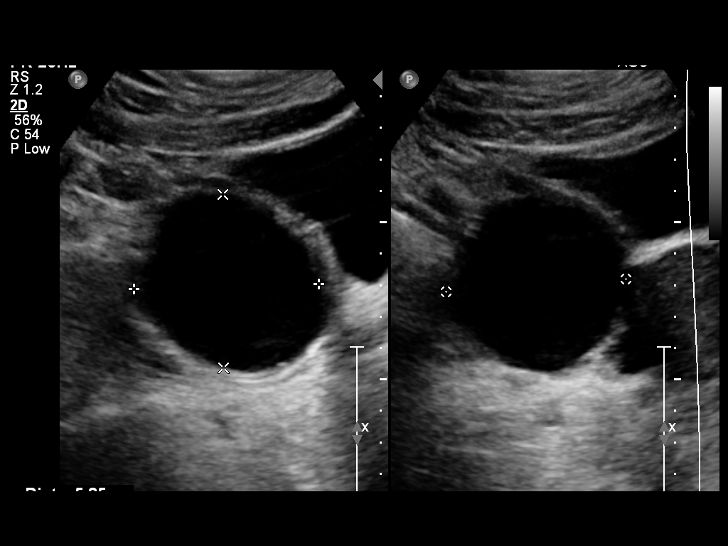
[im 16/47]
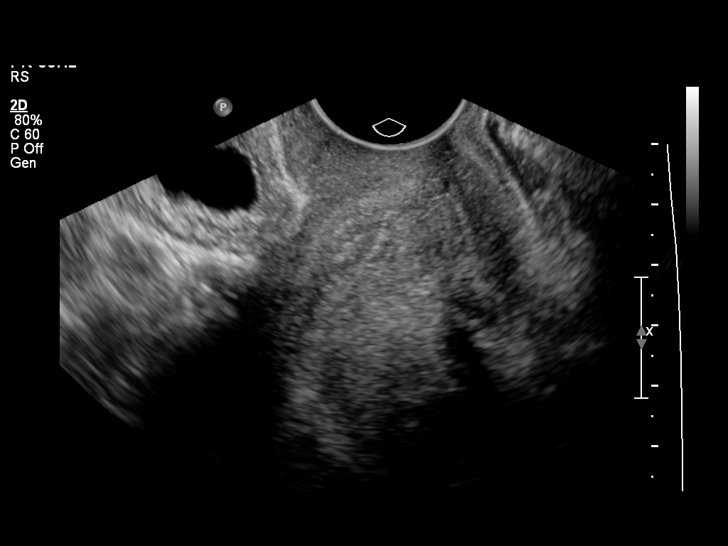
[im 20/47]
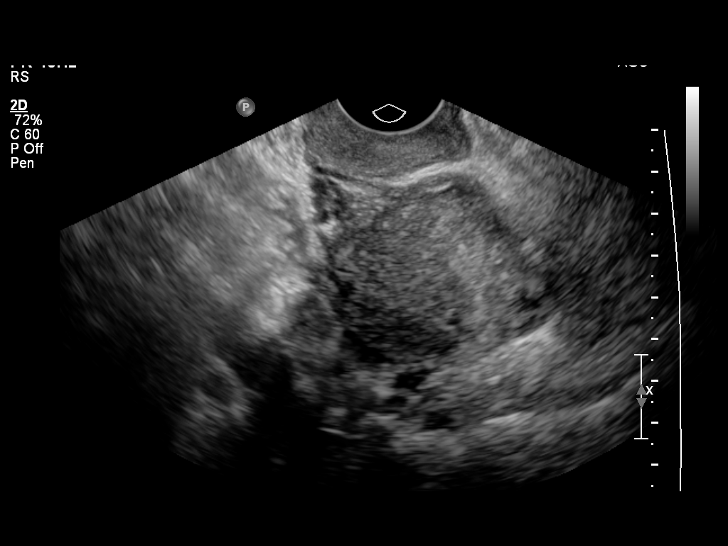
[im 24/47]
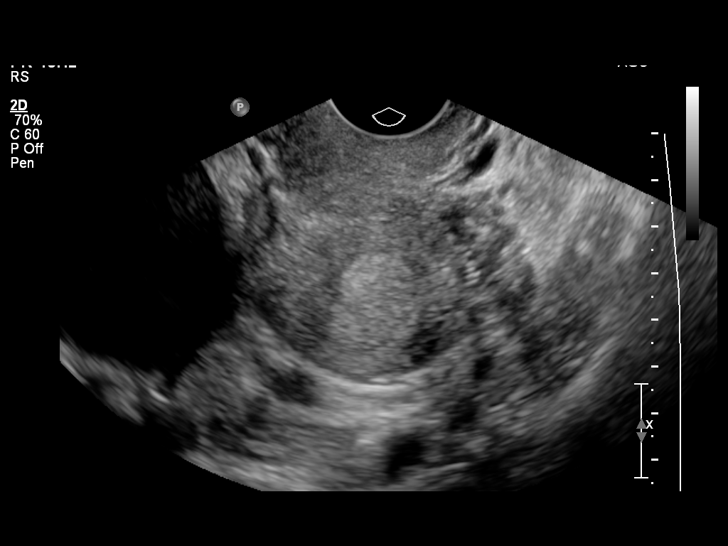
[im 27/47]
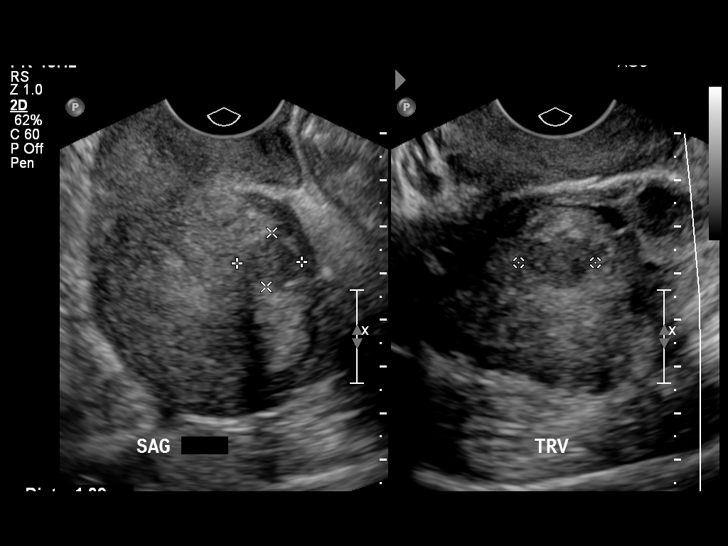
[im 31/47]
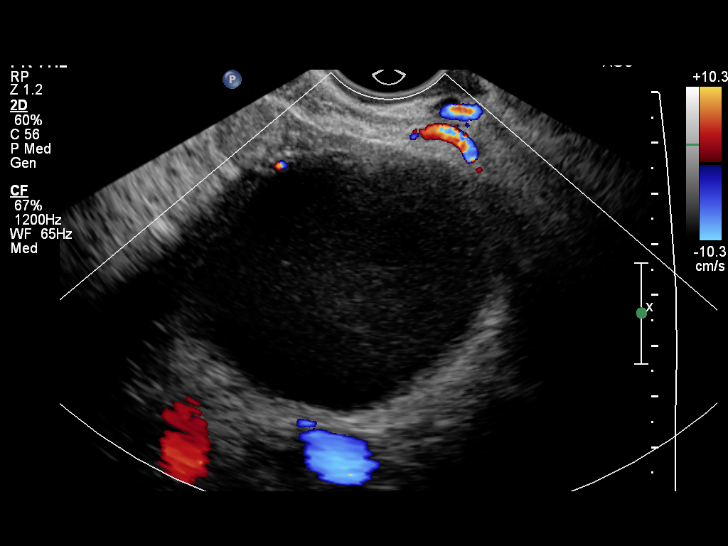
[im 35/47]
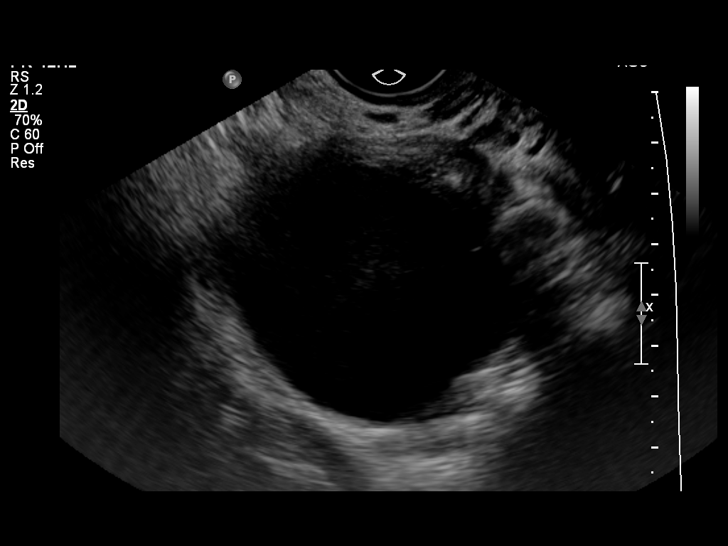
[im 39/47]
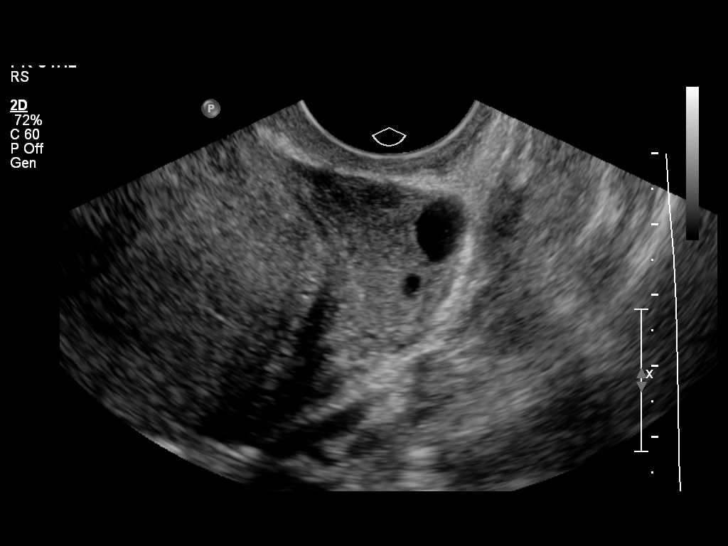
[im 43/47]
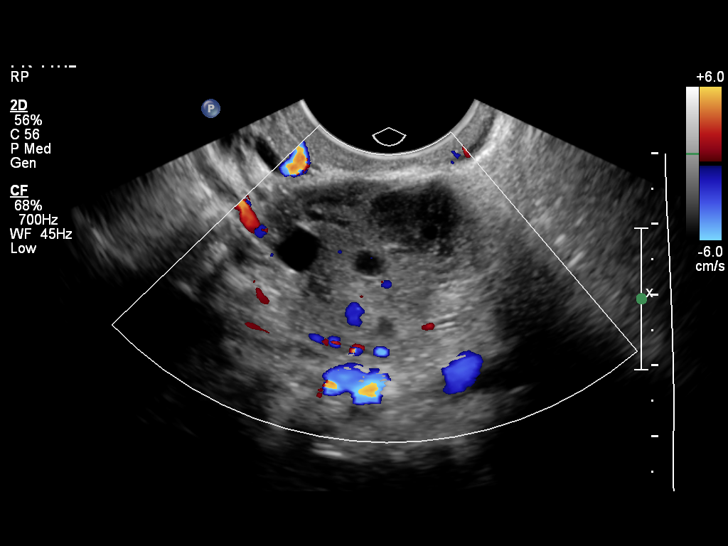
[im 47/47]
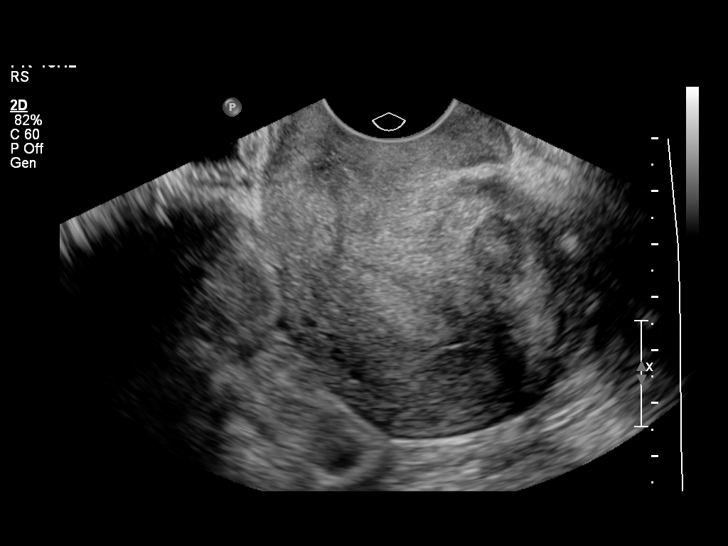

[13 of 25 positions shown; findings below may reference images not displayed]

FINDINGS: Uterus

Measurements: 8.1 x 4.9 x 4.7 cm. Retroverted, retroflexed.
Posterior fundal fibroid reidentified, intramural, 1.7 x 1.4 x
cm.

Endometrium

Thickness: 7 mm. Uniformly echogenic. No focal abnormality
visualized.

Right ovary

Measurements: 7.7 x 6.3 x 6.1 cm. A cyst with minimal low level
internal echoes measures 5.9 x 5.7 x 5.5 cm. This is essentially
stable compared to multiple prior exams, with apparent decrease in
internal echoes sequentially over prior studies.

Left ovary

Measurements: 3.8 x 2.9 x 1.7 cm. Normal appearance/no adnexal mass.

Other findings

No free fluid.
IMPRESSION: Right ovarian cyst reidentified, without significant interval change
in size over multiple prior exams dating back to 5886. Because the
patient has had intermittent imaging, this could be a recurrent
physiologic cyst, although persistence and its appearance on the
5886 prior exam suggests this may be an endometrioma with evolution
of its internal contents over time. Because the cyst is essentially
anechoic currently, no further followup is likely needed according
to the Society for [employer] in Ultrasound consensus criteria. If
the patient has pelvic symptoms suggesting endometriosis, this could
be confirmed at pelvic MRI if needed.

Posterior intramural fundal fibroid reidentified.
